# Patient Record
Sex: Female | Born: 1950 | Race: White | Hispanic: No | Marital: Married | Smoking: Never smoker
Health system: Southern US, Community
[De-identification: ages and names within clinical notes are randomized; demographics above are authoritative.]

## PROBLEM LIST (undated history)

## (undated) DIAGNOSIS — L0390 Cellulitis, unspecified: Secondary | ICD-10-CM

## (undated) DIAGNOSIS — M1990 Unspecified osteoarthritis, unspecified site: Secondary | ICD-10-CM

## (undated) DIAGNOSIS — I10 Essential (primary) hypertension: Secondary | ICD-10-CM

## (undated) DIAGNOSIS — H9190 Unspecified hearing loss, unspecified ear: Secondary | ICD-10-CM

## (undated) DIAGNOSIS — E669 Obesity, unspecified: Secondary | ICD-10-CM

## (undated) DIAGNOSIS — E7800 Pure hypercholesterolemia, unspecified: Secondary | ICD-10-CM

## (undated) DIAGNOSIS — E119 Type 2 diabetes mellitus without complications: Secondary | ICD-10-CM

## (undated) HISTORY — PX: CHOLECYSTECTOMY: SHX55

## (undated) HISTORY — PX: ABDOMINAL HYSTERECTOMY: SHX81

---

## 1998-02-18 ENCOUNTER — Emergency Department (HOSPITAL_COMMUNITY): Admission: EM | Admit: 1998-02-18 | Discharge: 1998-02-18 | Payer: Self-pay | Admitting: Emergency Medicine

## 2000-09-26 ENCOUNTER — Encounter: Payer: Self-pay | Admitting: Emergency Medicine

## 2000-09-26 ENCOUNTER — Emergency Department (HOSPITAL_COMMUNITY): Admission: EM | Admit: 2000-09-26 | Discharge: 2000-09-26 | Payer: Self-pay | Admitting: Emergency Medicine

## 2005-02-08 ENCOUNTER — Emergency Department: Payer: Self-pay | Admitting: Emergency Medicine

## 2005-03-01 ENCOUNTER — Observation Stay: Payer: Self-pay

## 2005-03-01 ENCOUNTER — Other Ambulatory Visit: Payer: Self-pay

## 2005-05-15 ENCOUNTER — Ambulatory Visit: Payer: Self-pay | Admitting: Gastroenterology

## 2006-06-19 ENCOUNTER — Ambulatory Visit: Payer: Self-pay | Admitting: Family Medicine

## 2006-07-02 ENCOUNTER — Ambulatory Visit: Payer: Self-pay | Admitting: Family Medicine

## 2006-10-15 ENCOUNTER — Ambulatory Visit: Payer: Self-pay | Admitting: Family Medicine

## 2006-10-16 ENCOUNTER — Ambulatory Visit: Payer: Self-pay | Admitting: Family Medicine

## 2006-11-10 ENCOUNTER — Ambulatory Visit: Payer: Self-pay | Admitting: Family Medicine

## 2006-11-28 ENCOUNTER — Ambulatory Visit: Payer: Self-pay | Admitting: Family Medicine

## 2006-12-27 ENCOUNTER — Ambulatory Visit: Payer: Self-pay | Admitting: Family Medicine

## 2007-02-06 ENCOUNTER — Ambulatory Visit: Payer: Self-pay | Admitting: Family Medicine

## 2007-02-07 ENCOUNTER — Emergency Department: Payer: Self-pay | Admitting: Emergency Medicine

## 2008-01-17 ENCOUNTER — Emergency Department: Payer: Self-pay | Admitting: Emergency Medicine

## 2008-01-26 ENCOUNTER — Ambulatory Visit: Payer: Self-pay | Admitting: Family Medicine

## 2008-02-29 ENCOUNTER — Ambulatory Visit: Payer: Self-pay | Admitting: Cardiology

## 2008-03-08 ENCOUNTER — Ambulatory Visit: Payer: Self-pay | Admitting: Surgery

## 2008-03-10 ENCOUNTER — Ambulatory Visit: Payer: Self-pay | Admitting: Surgery

## 2008-05-23 ENCOUNTER — Ambulatory Visit: Payer: Self-pay | Admitting: Family Medicine

## 2009-01-26 IMAGING — US ABDOMEN ULTRASOUND
1 series · 17 of 25 positions shown · non-contrast
Comparison: none

REASON FOR EXAM: PT is Deaf interpreter notified RUQ pain IF neg Us DO
HIDA WITH CCK
COMMENTS:

[Series 1: abdomen ultrasound · 17 of 52 slices shown]
[im 1/52]
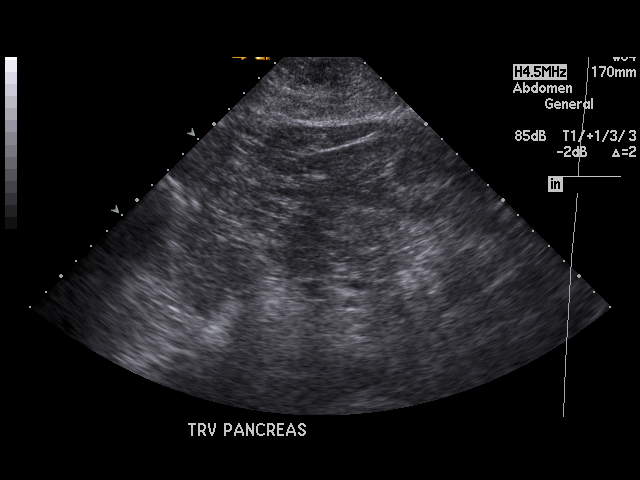
[im 5/52]
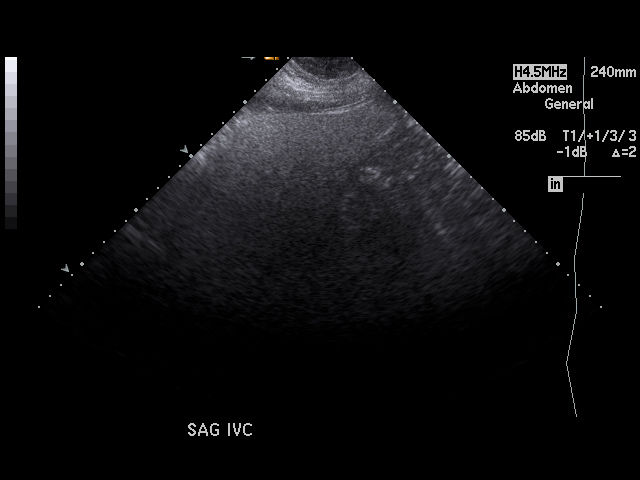
[im 7/52]
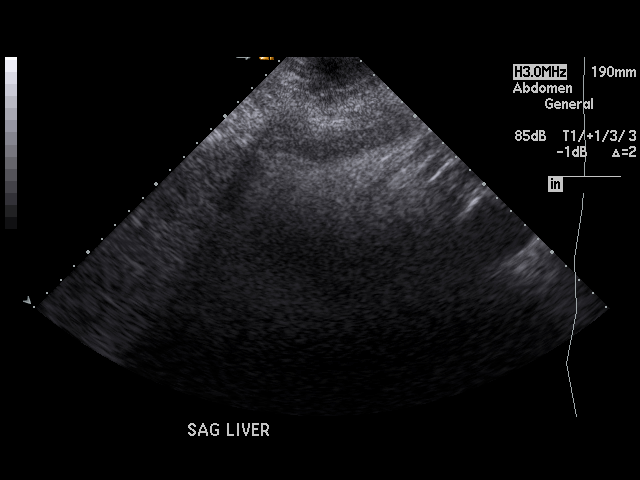
[im 11/52]
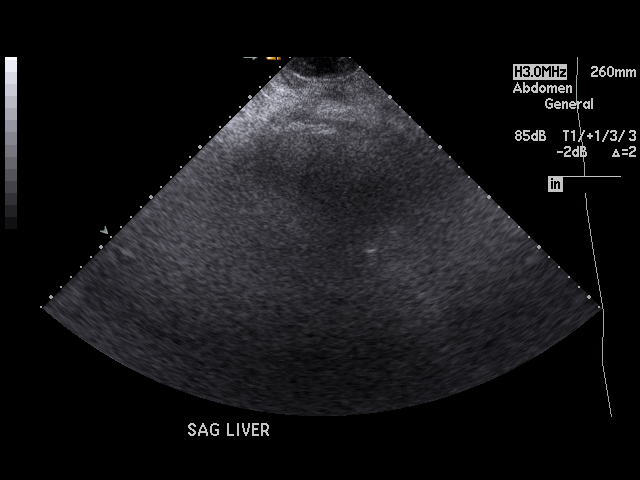
[im 13/52]
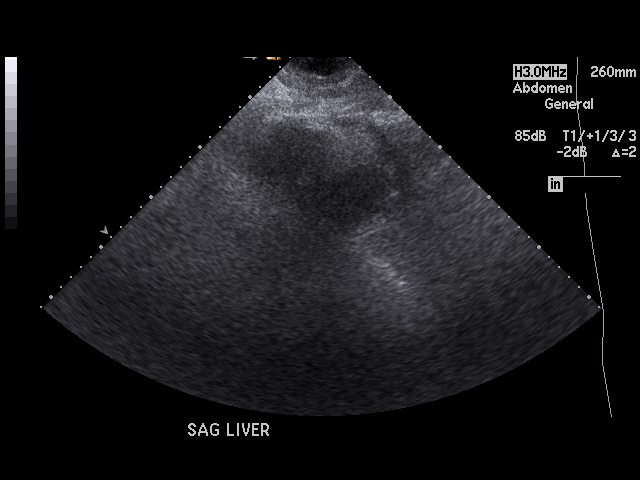
[im 18/52]
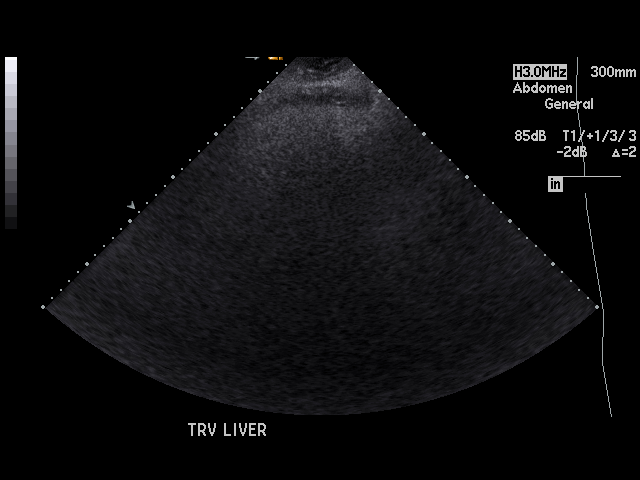
[im 20/52]
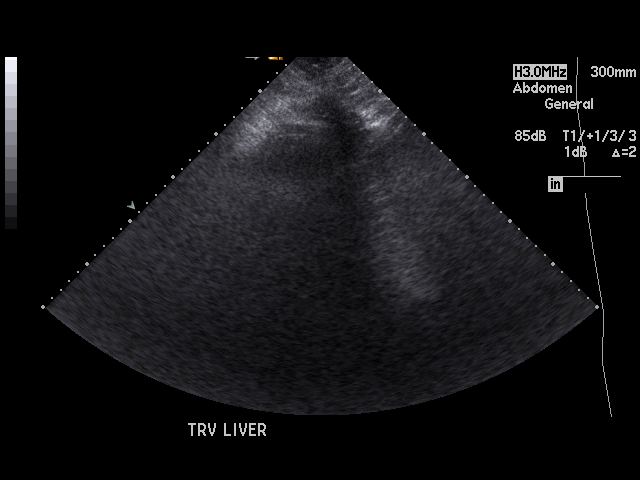
[im 24/52]
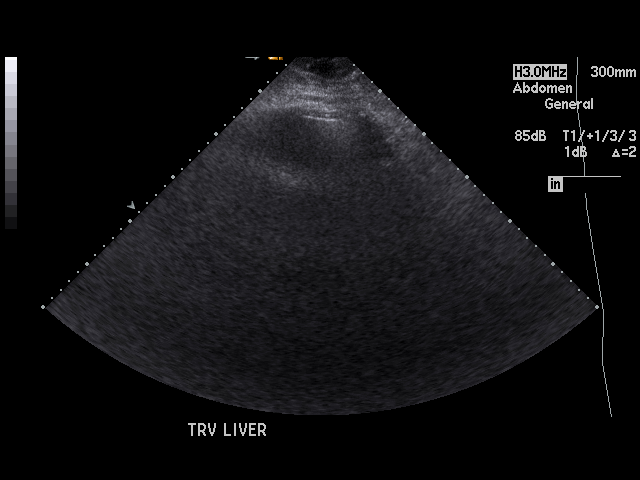
[im 26/52]
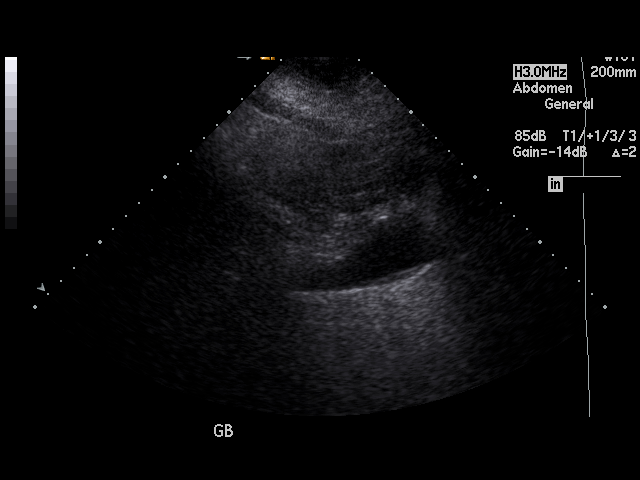
[im 28/52]
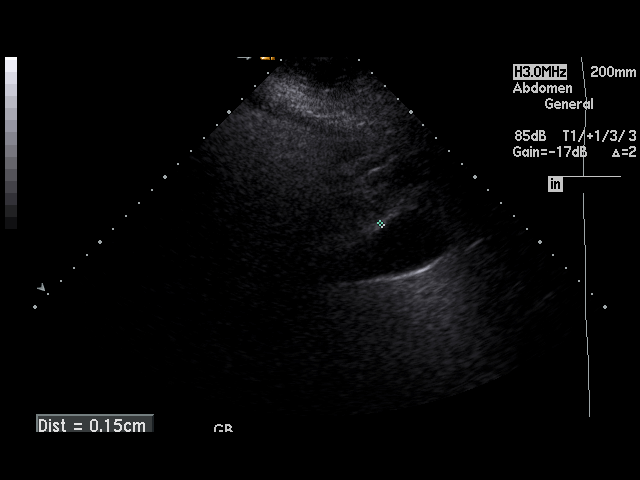
[im 32/52]
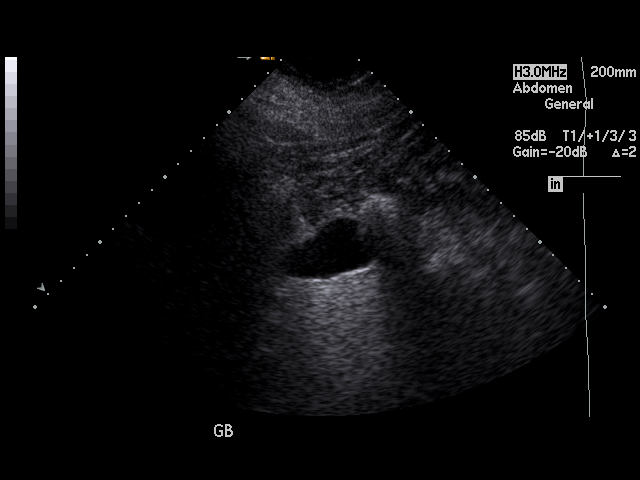
[im 35/52]
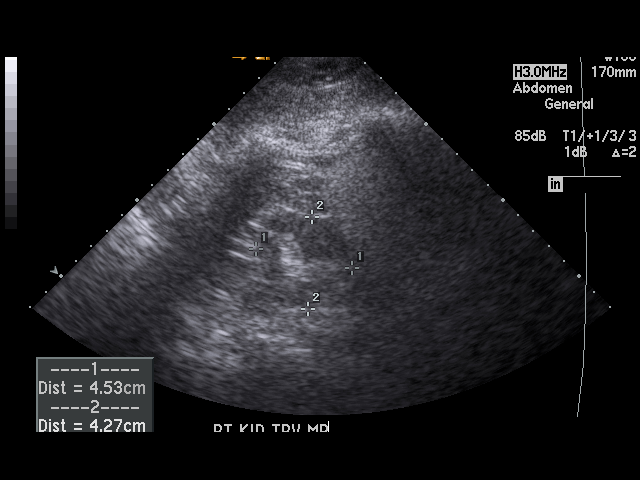
[im 39/52]
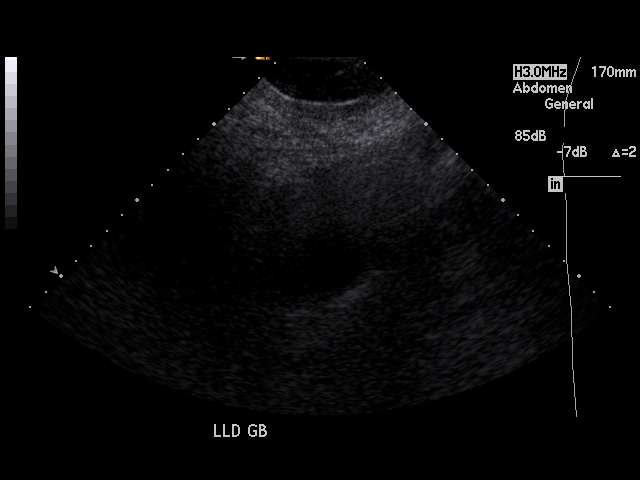
[im 41/52]
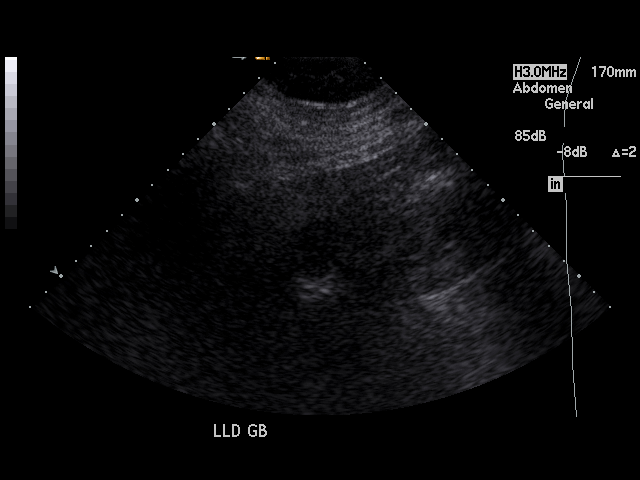
[im 45/52]
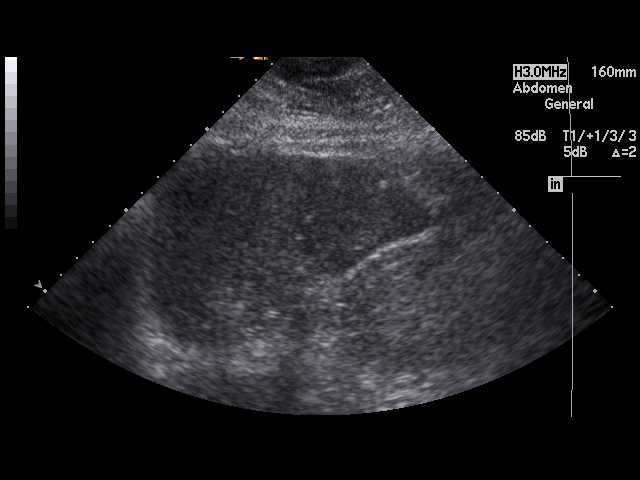
[im 47/52]
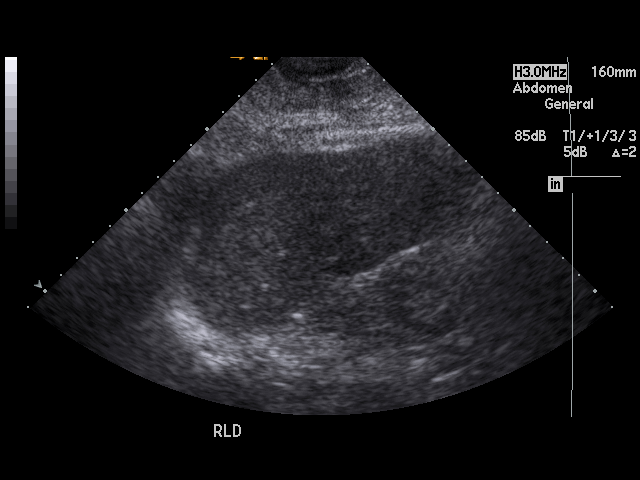
[im 52/52]
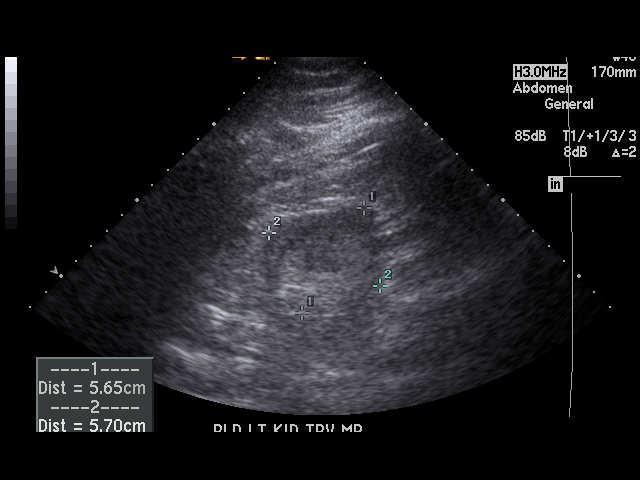

[17 of 25 positions shown; findings below may reference images not displayed]

PROCEDURE:     US  - US ABDOMEN GENERAL SURVEY  - January 26, 2008  [DATE]

RESULT:     The liver is dense, consistent with fatty infiltration. The
spleen is within normal limits for size.

There are noted a few calcifications in the spleen compatible with calcified
granulomas. The abdominal aorta and inferior vena cava are not visualized
adequately for evaluation on this exam. The pancreas also is obscured. No
gallstones are seen. There is no thickening of the gallbladder wall. The
common bile duct measures 6 mm in diameter which is within normal limits.
The kidneys show no hydronephrosis.
IMPRESSION: 1. No gallstones are seen.
2. Probable fatty infiltration of the liver.
3. There is no ascites.
4. The kidneys show no hydronephrosis.

## 2009-01-26 IMAGING — NM NUCLEAR MEDICINE HEPATOHBILIARY INCLUDE GB
2 series · 12 of 12 positions shown · non-contrast
Comparison: none

REASON FOR EXAM: PT is Deaf interpreter notified RUQ pain IF neg Us DO
HIDA WITH CCK
COMMENTS:

[Series 1000: gallbladder dynamic (results) · 4.80mm/px · 6 of 60 frames shown]
[frame 6/60]
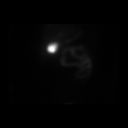
[frame 16/60]
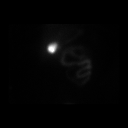
[frame 26/60]
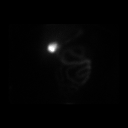
[frame 36/60]
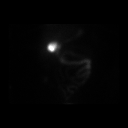
[frame 46/60]
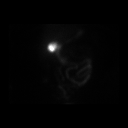
[frame 56/60]
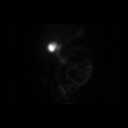

[Series 1000: gallbladder dynamic · 4.80mm/px · 6 of 60 frames shown]
[frame 6/60]
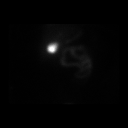
[frame 16/60]
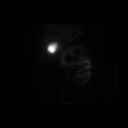
[frame 26/60]
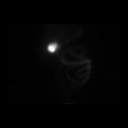
[frame 36/60]
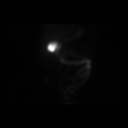
[frame 46/60]
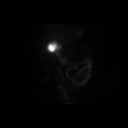
[frame 56/60]
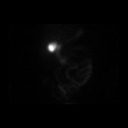

[12 of 12 positions shown; findings below may reference images not displayed]

PROCEDURE:     NM  - NM HEPATO WITH GB EJECT FRACTION  - January 26, 2008 [DATE]

RESULT:     Following intravenous administration of 7.9 mCi technetium-55m
choletec, there is noted tracer activity in the gallbladder, common duct and
proximal small bowel at 50 minutes.

The gallbladder ejection fraction at 30 minutes measures 16%, which is below
the normal range.
IMPRESSION: 1.     Normal hepatobiliary scan. The gallbladder ejection fraction measures
16%, which is abnormally low and is in the hypokinetic range.

## 2009-01-31 ENCOUNTER — Emergency Department: Payer: Self-pay | Admitting: Emergency Medicine

## 2009-02-08 ENCOUNTER — Ambulatory Visit: Payer: Self-pay | Admitting: Family Medicine

## 2009-02-09 ENCOUNTER — Ambulatory Visit: Payer: Self-pay | Admitting: Gastroenterology

## 2009-07-06 ENCOUNTER — Ambulatory Visit: Payer: Self-pay | Admitting: Family Medicine

## 2010-04-06 ENCOUNTER — Emergency Department: Payer: Self-pay | Admitting: Emergency Medicine

## 2011-02-02 ENCOUNTER — Emergency Department: Payer: Self-pay | Admitting: Emergency Medicine

## 2012-11-19 ENCOUNTER — Ambulatory Visit: Payer: Self-pay | Admitting: Family Medicine

## 2013-03-15 ENCOUNTER — Ambulatory Visit: Payer: Self-pay | Admitting: Physical Medicine and Rehabilitation

## 2013-06-18 ENCOUNTER — Ambulatory Visit: Payer: Self-pay | Admitting: Gastroenterology

## 2013-06-21 LAB — PATHOLOGY REPORT

## 2013-10-28 ENCOUNTER — Emergency Department: Payer: Self-pay | Admitting: Emergency Medicine

## 2013-10-28 LAB — BASIC METABOLIC PANEL
ANION GAP: 3 — AB (ref 7–16)
BUN: 15 mg/dL (ref 7–18)
CHLORIDE: 105 mmol/L (ref 98–107)
Calcium, Total: 9 mg/dL (ref 8.5–10.1)
Co2: 32 mmol/L (ref 21–32)
Creatinine: 0.84 mg/dL (ref 0.60–1.30)
EGFR (African American): >60
EGFR (Non-African Amer.): >60
Glucose: 102 mg/dL — ABNORMAL HIGH (ref 65–99)
Osmolality: 280 (ref 275–301)
Potassium: 3.5 mmol/L (ref 3.5–5.1)
Sodium: 140 mmol/L (ref 136–145)

## 2013-10-28 LAB — TROPONIN I: Troponin-I: <0.02 ng/mL

## 2013-10-28 LAB — CBC WITH DIFFERENTIAL/PLATELET
Basophil #: 0 10*3/uL (ref 0.0–0.1)
Basophil %: 0.7 %
Eosinophil #: 0.1 10*3/uL (ref 0.0–0.7)
Eosinophil %: 2.5 %
HCT: 41.5 % (ref 35.0–47.0)
HGB: 14.1 g/dL (ref 12.0–16.0)
LYMPHS ABS: 1.2 10*3/uL (ref 1.0–3.6)
Lymphocyte %: 20.3 %
MCH: 28.3 pg (ref 26.0–34.0)
MCHC: 33.9 g/dL (ref 32.0–36.0)
MCV: 84 fL (ref 80–100)
MONO ABS: 0.3 x10 3/mm (ref 0.2–0.9)
MONOS PCT: 5.7 %
NEUTROS PCT: 70.8 %
Neutrophil #: 4.1 10*3/uL (ref 1.4–6.5)
PLATELETS: 125 10*3/uL — AB (ref 150–440)
RBC: 4.97 10*6/uL (ref 3.80–5.20)
RDW: 13.4 % (ref 11.5–14.5)
WBC: 5.8 10*3/uL (ref 3.6–11.0)

## 2013-10-28 LAB — URINALYSIS, COMPLETE
Bilirubin,UR: NEGATIVE
Blood: NEGATIVE
GLUCOSE, UR: NEGATIVE mg/dL (ref 0–75)
KETONE: NEGATIVE
Leukocyte Esterase: NEGATIVE
NITRITE: NEGATIVE
PH: 6 (ref 4.5–8.0)
Protein: NEGATIVE
RBC,UR: NONE SEEN /HPF (ref 0–5)
SPECIFIC GRAVITY: 1.015 (ref 1.003–1.030)

## 2014-06-07 ENCOUNTER — Observation Stay: Payer: Self-pay

## 2014-06-07 LAB — CBC WITH DIFFERENTIAL/PLATELET
Basophil #: 0 10*3/uL (ref 0.0–0.1)
Basophil %: 0.2 %
Eosinophil #: 0 10*3/uL (ref 0.0–0.7)
Eosinophil %: 0 %
HCT: 41 % (ref 35.0–47.0)
HGB: 13.8 g/dL (ref 12.0–16.0)
Lymphocyte #: 0.6 10*3/uL — ABNORMAL LOW (ref 1.0–3.6)
Lymphocyte %: 4 %
MCH: 28.3 pg (ref 26.0–34.0)
MCHC: 33.6 g/dL (ref 32.0–36.0)
MCV: 84 fL (ref 80–100)
Monocyte #: 0.4 x10 3/mm (ref 0.2–0.9)
Monocyte %: 2.7 %
Neutrophil #: 13.8 10*3/uL — ABNORMAL HIGH (ref 1.4–6.5)
Neutrophil %: 93.1 %
Platelet: 112 10*3/uL — ABNORMAL LOW (ref 150–440)
RBC: 4.87 10*6/uL (ref 3.80–5.20)
RDW: 13.8 % (ref 11.5–14.5)
WBC: 14.9 10*3/uL — ABNORMAL HIGH (ref 3.6–11.0)

## 2014-06-07 LAB — COMPREHENSIVE METABOLIC PANEL
Albumin: 2.8 g/dL — ABNORMAL LOW (ref 3.4–5.0)
Alkaline Phosphatase: 70 U/L
Anion Gap: 6 — ABNORMAL LOW (ref 7–16)
BUN: 26 mg/dL — ABNORMAL HIGH (ref 7–18)
Bilirubin,Total: 1 mg/dL (ref 0.2–1.0)
Calcium, Total: 9.6 mg/dL (ref 8.5–10.1)
Chloride: 104 mmol/L (ref 98–107)
Co2: 29 mmol/L (ref 21–32)
Creatinine: 1.29 mg/dL (ref 0.60–1.30)
EGFR (African American): 50 — ABNORMAL LOW
EGFR (Non-African Amer.): 43 — ABNORMAL LOW
Glucose: 120 mg/dL — ABNORMAL HIGH (ref 65–99)
Osmolality: 283 (ref 275–301)
Potassium: 3.5 mmol/L (ref 3.5–5.1)
SGOT(AST): 46 U/L — ABNORMAL HIGH (ref 15–37)
SGPT (ALT): 61 U/L
Sodium: 139 mmol/L (ref 136–145)
Total Protein: 6.6 g/dL (ref 6.4–8.2)

## 2014-06-08 LAB — BASIC METABOLIC PANEL
Anion Gap: 3 — ABNORMAL LOW (ref 7–16)
BUN: 25 mg/dL — ABNORMAL HIGH (ref 7–18)
Calcium, Total: 8.8 mg/dL (ref 8.5–10.1)
Chloride: 103 mmol/L (ref 98–107)
Co2: 33 mmol/L — ABNORMAL HIGH (ref 21–32)
Creatinine: 1.32 mg/dL — ABNORMAL HIGH (ref 0.60–1.30)
EGFR (African American): 49 — ABNORMAL LOW
EGFR (Non-African Amer.): 42 — ABNORMAL LOW
Glucose: 115 mg/dL — ABNORMAL HIGH (ref 65–99)
Osmolality: 283 (ref 275–301)
Potassium: 3.3 mmol/L — ABNORMAL LOW (ref 3.5–5.1)
Sodium: 139 mmol/L (ref 136–145)

## 2014-06-08 LAB — CBC WITH DIFFERENTIAL/PLATELET
Basophil #: 0 10*3/uL (ref 0.0–0.1)
Basophil %: 0.1 %
Eosinophil #: 0 10*3/uL (ref 0.0–0.7)
Eosinophil %: 0.5 %
HCT: 36 % (ref 35.0–47.0)
HGB: 11.8 g/dL — ABNORMAL LOW (ref 12.0–16.0)
Lymphocyte #: 0.7 10*3/uL — ABNORMAL LOW (ref 1.0–3.6)
Lymphocyte %: 8 %
MCH: 28 pg (ref 26.0–34.0)
MCHC: 32.9 g/dL (ref 32.0–36.0)
MCV: 85 fL (ref 80–100)
Monocyte #: 0.4 x10 3/mm (ref 0.2–0.9)
Monocyte %: 4.4 %
Neutrophil #: 7.5 10*3/uL — ABNORMAL HIGH (ref 1.4–6.5)
Neutrophil %: 87 %
Platelet: 97 10*3/uL — ABNORMAL LOW (ref 150–440)
RBC: 4.23 10*6/uL (ref 3.80–5.20)
RDW: 13.8 % (ref 11.5–14.5)
WBC: 8.6 10*3/uL (ref 3.6–11.0)

## 2014-06-09 LAB — CBC WITH DIFFERENTIAL/PLATELET
Basophil #: 0 10*3/uL (ref 0.0–0.1)
Basophil %: 0.3 %
Eosinophil #: 0.2 10*3/uL (ref 0.0–0.7)
Eosinophil %: 1.8 %
HCT: 38.1 % (ref 35.0–47.0)
HGB: 12.9 g/dL (ref 12.0–16.0)
Lymphocyte #: 1.4 10*3/uL (ref 1.0–3.6)
Lymphocyte %: 16.5 %
MCH: 28.6 pg (ref 26.0–34.0)
MCHC: 34 g/dL (ref 32.0–36.0)
MCV: 84 fL (ref 80–100)
Monocyte #: 0.6 x10 3/mm (ref 0.2–0.9)
Monocyte %: 7.3 %
Neutrophil #: 6.2 10*3/uL (ref 1.4–6.5)
Neutrophil %: 74.1 %
Platelet: 133 10*3/uL — ABNORMAL LOW (ref 150–440)
RBC: 4.52 10*6/uL (ref 3.80–5.20)
RDW: 13.8 % (ref 11.5–14.5)
WBC: 8.4 10*3/uL (ref 3.6–11.0)

## 2014-06-09 LAB — BASIC METABOLIC PANEL
Anion Gap: 8 (ref 7–16)
BUN: 16 mg/dL (ref 7–18)
Calcium, Total: 8.7 mg/dL (ref 8.5–10.1)
Chloride: 100 mmol/L (ref 98–107)
Co2: 29 mmol/L (ref 21–32)
Creatinine: 1.19 mg/dL (ref 0.60–1.30)
EGFR (African American): 55 — ABNORMAL LOW
EGFR (Non-African Amer.): 48 — ABNORMAL LOW
Glucose: 115 mg/dL — ABNORMAL HIGH (ref 65–99)
Osmolality: 276 (ref 275–301)
Potassium: 3.1 mmol/L — ABNORMAL LOW (ref 3.5–5.1)
Sodium: 137 mmol/L (ref 136–145)

## 2014-06-09 LAB — MAGNESIUM: Magnesium: 2 mg/dL

## 2014-06-10 LAB — BASIC METABOLIC PANEL
ANION GAP: 7 (ref 7–16)
BUN: 13 mg/dL (ref 7–18)
CO2: 33 mmol/L — AB (ref 21–32)
CREATININE: 1.09 mg/dL (ref 0.60–1.30)
Calcium, Total: 8.9 mg/dL (ref 8.5–10.1)
Chloride: 98 mmol/L (ref 98–107)
EGFR (African American): >60
EGFR (Non-African Amer.): 53 — ABNORMAL LOW
Glucose: 139 mg/dL — ABNORMAL HIGH (ref 65–99)
OSMOLALITY: 278 (ref 275–301)
POTASSIUM: 3.2 mmol/L — AB (ref 3.5–5.1)
Sodium: 138 mmol/L (ref 136–145)

## 2014-06-12 LAB — CULTURE, BLOOD (SINGLE)

## 2014-06-13 LAB — CBC WITH DIFFERENTIAL/PLATELET
BASOS ABS: 0.1 10*3/uL (ref 0.0–0.1)
Basophil %: 0.5 %
Eosinophil #: 0.5 10*3/uL (ref 0.0–0.7)
Eosinophil %: 4.6 %
HCT: 38.6 % (ref 35.0–47.0)
HGB: 12.7 g/dL (ref 12.0–16.0)
LYMPHS PCT: 22 %
Lymphocyte #: 2.3 10*3/uL (ref 1.0–3.6)
MCH: 27.8 pg (ref 26.0–34.0)
MCHC: 32.8 g/dL (ref 32.0–36.0)
MCV: 85 fL (ref 80–100)
Monocyte #: 0.8 x10 3/mm (ref 0.2–0.9)
Monocyte %: 7.6 %
NEUTROS ABS: 6.8 10*3/uL — AB (ref 1.4–6.5)
Neutrophil %: 65.3 %
PLATELETS: 224 10*3/uL (ref 150–440)
RBC: 4.55 10*6/uL (ref 3.80–5.20)
RDW: 13.4 % (ref 11.5–14.5)
WBC: 10.4 10*3/uL (ref 3.6–11.0)

## 2014-06-13 LAB — BASIC METABOLIC PANEL
Anion Gap: 9 (ref 7–16)
BUN: 18 mg/dL (ref 7–18)
CALCIUM: 9 mg/dL (ref 8.5–10.1)
Chloride: 94 mmol/L — ABNORMAL LOW (ref 98–107)
Co2: 34 mmol/L — ABNORMAL HIGH (ref 21–32)
Creatinine: 0.98 mg/dL (ref 0.60–1.30)
EGFR (African American): >60
Glucose: 138 mg/dL — ABNORMAL HIGH (ref 65–99)
OSMOLALITY: 278 (ref 275–301)
Potassium: 3.5 mmol/L (ref 3.5–5.1)
Sodium: 137 mmol/L (ref 136–145)

## 2014-09-18 ENCOUNTER — Emergency Department: Payer: Self-pay | Admitting: Internal Medicine

## 2014-09-18 LAB — COMPREHENSIVE METABOLIC PANEL
ALK PHOS: 82 U/L
Albumin: 3.3 g/dL — ABNORMAL LOW (ref 3.4–5.0)
Anion Gap: 6 — ABNORMAL LOW (ref 7–16)
BILIRUBIN TOTAL: 0.4 mg/dL (ref 0.2–1.0)
BUN: 21 mg/dL — ABNORMAL HIGH (ref 7–18)
CO2: 30 mmol/L (ref 21–32)
Calcium, Total: 9.1 mg/dL (ref 8.5–10.1)
Chloride: 106 mmol/L (ref 98–107)
Creatinine: 0.8 mg/dL (ref 0.60–1.30)
EGFR (African American): >60
EGFR (Non-African Amer.): >60
GLUCOSE: 93 mg/dL (ref 65–99)
OSMOLALITY: 286 (ref 275–301)
Potassium: 3.8 mmol/L (ref 3.5–5.1)
SGOT(AST): 19 U/L (ref 15–37)
SGPT (ALT): 23 U/L
Sodium: 142 mmol/L (ref 136–145)
TOTAL PROTEIN: 7 g/dL (ref 6.4–8.2)

## 2014-09-18 LAB — CBC
HCT: 41.3 % (ref 35.0–47.0)
HGB: 13.7 g/dL (ref 12.0–16.0)
MCH: 27.9 pg (ref 26.0–34.0)
MCHC: 33.3 g/dL (ref 32.0–36.0)
MCV: 84 fL (ref 80–100)
Platelet: 158 10*3/uL (ref 150–440)
RBC: 4.93 10*6/uL (ref 3.80–5.20)
RDW: 13.5 % (ref 11.5–14.5)
WBC: 7.2 10*3/uL (ref 3.6–11.0)

## 2014-09-18 LAB — URINALYSIS, COMPLETE
BACTERIA: NONE SEEN
BLOOD: NEGATIVE
Bilirubin,UR: NEGATIVE
Glucose,UR: NEGATIVE mg/dL (ref 0–75)
KETONE: NEGATIVE
LEUKOCYTE ESTERASE: NEGATIVE
Nitrite: NEGATIVE
Ph: 7 (ref 4.5–8.0)
Protein: NEGATIVE
RBC,UR: NONE SEEN /HPF (ref 0–5)
Specific Gravity: 1.011 (ref 1.003–1.030)
Squamous Epithelial: <1
WBC UR: <1 /HPF (ref 0–5)

## 2014-10-02 ENCOUNTER — Other Ambulatory Visit: Payer: Self-pay | Admitting: Internal Medicine

## 2015-01-19 ENCOUNTER — Emergency Department: Payer: Self-pay | Admitting: Emergency Medicine

## 2015-02-18 NOTE — H&P (Signed)
PATIENT NAME:  Judith Mitchell, Judith Mitchell MR#:  [id number] DATE OF BIRTH:  [birth year]  PRIMARY CARE PHYSICIAN:  Irven Easterly. Kary Kos, MD  CHIEF COMPLAINT: Leg pain.   HISTORY OF PRESENT ILLNESS: This is a 64 year old female who presents to the ER after a fall on Saturday. She was sitting in the rocking chair and fell on her knee.  Sunday, she felt very weak and terrible, did not want to eat. Yesterday, she went into the walk-in clinic and was given an antibiotic for cellulitis of the leg. It has been getting worse and worse and extending up the leg. She vomited a couple times yesterday and today. When standing she is weak and dizzy and had to hold on. Her leg is swollen and very painful.   In the ER, she was found to have a cellulitis up the entire right lower leg from the ankle up above the knee and hospitalist services were contacted for further evaluation.   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 Diabetes, hyperlipidemia, and hypertension.   PAST SURGICAL HISTORY: Hysterectomy.   ALLERGIES: No known drug allergies.   MEDICATIONS: Include metformin 500 mg daily, Naprosyn 500 mg daily, Dyazide 25/37.5 one tablet daily, Zocor 40 mg at bedtime.   SOCIAL HISTORY: No alcohol. No smoking. No drug use. Works at [employer].   FAMILY HISTORY: Father died of an MI. Mother died of lung issue, but also had heart disease.   REVIEW OF SYSTEMS:  CONSTITUTIONAL: Positive for chills. Positive for weakness. No weight gain. No weight loss. EYES: She does wear glasses and had some blurry vision of the left eye. EARS, NOSE, MOUTH AND THROAT: The patient is deaf. No runny nose. No sore throat. No difficulty swallowing.  CARDIOVASCULAR: No chest pain. No palpitations.  RESPIRATORY:  No shortness of breath. No cough. No sputum. No hemoptysis.   GASTROINTESTINAL: Positive for nausea. Positive for vomiting. No abdominal pain. Had diarrhea today. No bright red blood  per rectum. No melena.   GENITOURINARY: No burning on urination. No hematuria.  MUSCULOSKELETAL: Positive for left leg pain. Also some shoulder and back pain from her fall.  SKIN: Extending redness on the skin.  NEUROLOGIC: Feels like she is going to pass out. Has to hold when walking around.  PSYCHIATRIC: Positive for depression.  HEMATOLOGIC AND LYMPHATIC: No anemia. No easy bruising or bleeding.   PHYSICAL EXAMINATION: VITAL SIGNS: On presentation to the ER included a temperature of 98, pulse 88 respirations 20, blood pressure 115/69, pulse oximetry 97% on room air.  GENERAL: No respiratory distress.  EYES: Conjunctivae and lids normal. Pupils equal, round, and reactive to light. Extraocular muscles intact. No nystagmus.  EARS, NOSE, MOUTH, AND THROAT: Tympanic membranes: No erythema. Nasal mucosa: No erythema. Throat:  No erythema, no exudate seen. Lips and gums: No lesions.  NECK: No JVD. No bruits. No lymphadenopathy. No thyromegaly. No thyroid nodules palpated.  LUNGS: Clear to auscultation. No use of accessory muscles to breathe. No rhonchi, rales, or wheeze heard.  CARDIOVASCULAR SYSTEM: S1, S2 normal. No gallops, rubs, or murmurs heard. Carotid upstroke 2+ bilaterally. No bruits.  EXTREMITIES: Dorsalis pedis pulses 2+ bilaterally, 2+ edema bilateral lower extremity.  ABDOMEN: Soft, nontender. No organomegaly, splenomegaly. Normoactive bowel sounds. No masses felt.  LYMPHATIC: No lymph nodes in the neck or groin.  MUSCULOSKELETAL: 2+ edema. No clubbing. No cyanosis.  SKIN: Erythema on the right leg extending from just below the ankle to above the right  knee medially. There is an area of skin opening on the medial right leg and most of the patient's pain is in the posterior side.  NEUROLOGIC: Cranial nerves II through XII grossly intact. Deep tendon reflexes 1+ bilateral lower extremities.  PSYCHIATRIC: The patient is oriented to person, place, and time.   LABORATORY AND RADIOLOGICAL  DATA: White blood cell count 14.9, H and H 13.8 and 41.0, platelet count of 112,000, glucose 120, BUN 26, creatinine 1.29, sodium 139, potassium 3.5, chloride 104, CO2 of 29, calcium 9.6. Liver function tests: AST slightly elevated at 46, albumin low at 2.8.   ASSESSMENT AND PLAN: 1.  Cellulitis with leukocytosis. Since the patient is a diabetic, will get aggressive with antibiotics with vancomycin and Unasyn and continue to monitor daily.  2.  Thrombocytopenia, looking back at old laboratories this looks chronic.  3.  Diabetes. We will hold on metformin at this time and go with sliding scale coverage.  4.  Hypertension.  Continue Dyazide, but watch creatinine closely.  5.  Hyperlipidemia.  Continue Zocor.  6.  Renal insufficiency. We will give gentle intravenous fluid hydration   Time spent on admission, 55 minutes. Translation via sign language interpreter provided by the hospital.    ____________________________ Tana Conch. Leslye Peer, MD rjw:LT D: 06/07/2014 16:48:24 ET T: 06/07/2014 17:37:17 ET JOB#: [id number]  cc: Tana Conch. Leslye Peer, MD, <Dictator> Irven Easterly. Kary Kos, [place] MD ELECTRONICALLY SIGNED 06/07/2014 20:00

## 2015-02-18 NOTE — Consult Note (Signed)
PATIENT NAME:  Judith Mitchell, Judith Mitchell MR#:  [id number] DATE OF BIRTH:  1951/07/20  DATE OF CONSULTATION:  06/09/2014  CONSULTING PHYSICIAN:  Cheral Marker. Ola Spurr, MD  REQUESTING PHYSICIAN: Dr. Leslye Peer.  REASON FOR CONSULTATION: Cellulitis.   HISTORY OF PRESENT ILLNESS: This is a very pleasant 64 year old female with a history of diabetes, as well as deafness. She was admitted August 11th after falling on her right knee. She then progressively became weak and had redness in her leg. She was seen in a walk-in clinic and given antibiotics but she continued to get increasing spreading redness and weakness and dizziness. She was also having vomiting prior to admission. On the 11th she was admitted to the hospitalist service with worsening cellulitis for IV antibiotics.   PAST MEDICAL HISTORY:  1. Deafness.  2. Diabetes.  3. Hyperlipidemia.  4. Hypertension.   PAST SURGICAL HISTORY: Hysterectomy.   ALLERGIES: No known drug allergies.   ANTIBIOTICS SINCE ADMISSION: Include Unasyn begun August 11th and vancomycin begun August 10th.  SOCIAL HISTORY: She is married. She does not smoke or drink. She lives with her husband.   FAMILY HISTORY: Noncontributory.   PHYSICAL EXAMINATION:  VITAL SIGNS: The patient is afebrile currently and has been throughout the admission. Current temperature 97.7, pulse 77, blood pressure 139/83, respirations 18, sat 98% on room air.  GENERAL: She is pleasant, interactive, overweight, lying in bed.  HEENT: Pupils equal, round, and reactive to light and accommodation. Extraocular movements are intact. Sclerae anicteric. Oropharynx clear.  NECK: Supple.  HEART: Regular.  LUNGS: Clear.  ABDOMEN: Soft, nontender, obese, nondistended.  EXTREMITIES: Her right lower extremity has edema and erythema from just below the knee to the ankle. There are small vesicles. On the posterior part of the calf there are some small bullae. There is also an area of erythema at the distal top of  the inner right thigh. No purulence is noted.   LABORATORY DATA: White blood count on admission 14.9, now down to 8.4. Hemoglobin 12.9, platelets 133,000. Renal function is normal with a creatinine of 1.19. Sugars have been well controlled. LFTs are normal except slight elevation of AST. Blood cultures x 2 are negative. No imaging is available.   IMPRESSION: A 64 year old with diabetes and deafness, admitted with a cellulitis of the right lower extremity with minimal responsiveness over the last 2 days. She does have diabetes, but this seems relatively well controlled. There is no evidence of purulence or drainable abscess.   I suspect she needs elevation of the leg as when I saw her, she had it flat in bed. She is not toxic and has stable vital signs, has not been febrile and her white count has improved.   RECOMMENDATIONS:  1. Continue current antibiotics.  2. I have advised the patient and the nurse to provide 6 pillows to elevate the legs as much as possible.  3. This will likely progress and the skin will desquamate. It is unlikely she will need prolonged course of IV antibiotics for more than the next 2-3 days and hopefully will be able to be discharged in the next several days on oral antibiotics. Thank you for the consult. I will be glad to follow with you.      ____________________________ Cheral Marker. Ola Spurr, MD dpf:lt D: 06/09/2014 15:51:32 ET T: 06/09/2014 16:09:57 ET JOB#: [id number]  cc: Cheral Marker. Ola Spurr, MD, <Dictator> Shlome Baldree Ola Spurr MD ELECTRONICALLY SIGNED 06/17/2014 22:28

## 2015-02-18 NOTE — Discharge Summary (Signed)
PATIENT NAME:  Judith Mitchell, DELANCY MR#:  [id number] DATE OF BIRTH:  20-Jan-1951  ADMITTING DIAGNOSIS:  Cellulitis with leukocytosis.  ASSOCIATED DIAGNOSES: 1.  Thrombocytopenia.  2.  Diabetes.  3.  Hypertension.  4.  Hyperlipidemia.   CONSULTATIONS:  Cheral Marker. Ola Spurr, MD, infectious disease.   PROCEDURES: None.   BRIEF HISTORY OF PRESENT ILLNESS:  This very pleasant 65 year old female presents to the Emergency Room after fall on Saturday. She reports that she was sitting in a rocking chair and fell onto her knee. She felt suddenly very weak. She went to a walk-in clinic and was given an antibiotic for cellulitis of the right leg. For the days prior to presentation, pain, swelling, and redness in the right leg continued to get worse despite oral antibiotic treatment. She reported that she had vomited several times on the day before  admission.  Hospitalist services were requested to admit for cellulitis.  HOSPITAL COURSE AND TREATMENT: Problem #1:  Cellulitis of the right leg: The patient followed during hospitalization by  infectious disease. She was initially treated with vancomycin and Unasyn upon admission. She was started on Ancef on August 14th with marketed improvement. She was discharged on Keflex 500 mg every 6 hours for 1 week and will follow up in the ID clinic with Dr. Ola Spurr on Friday, August 20th at noon.  Problem #2:  Diabetes: Continue metformin.  Problem #3:  Hypertension: Continue hydrochlorothiazide-triamterene.  Problem #4:  Hyperlipidemia: Continue Zocor.     DISCHARGE EXAMINATION: VITALS: Temperature 98 degrees, pulse 79, respirations 18, blood pressure 121/76, oxygenation is 96% on room air.  GENERAL: No acute distress. The patient is comfortable in the bed.   HEENT: Pupils are equal, round, and reactive, conjunctivae clear. Oral mucosa is pink and moist. Oropharynx is clear.  RESPIRATORY: Lungs are clear to auscultation bilaterally with good air movement.   CARDIOLOGY:  Regular rate and rhythm, no murmurs, rubs, or gallops, no JVD. No edema.  ABDOMEN: Soft, nontender, nondistended. No guarding, no rebound.  EXTREMITIES AND SKIN:  The right lower leg is erythematous from the ankle to the knee, much less than on prior exams. Small pustules have resolved. There is an area of blistering on the medial knee with no open wounds.  NEUROLOGIC: Cranial nerves are grossly intact with the exception of hearing. Motor and sensory function is intact.  PSYCHIATRIC: The patient is alert and oriented. She has good insight.   LABORATORY DATA: On day of discharge, sodium 137, potassium 3.5, chloride 94, bicarbonate 34, BUN 18, creatinine 0.9, glucose 138. White blood cells 10.4, hemoglobin 12.7, platelets 224,000,  MCV is 85.   CONDITION ON DISCHARGE:  Good.   DISPOSITION: The patient is discharged to home with her husband.   DISCHARGE MEDICATIONS: 1.  Naproxen 500 mg tablet, 1 tablet orally every 8 hours as needed for pain.  2.  Simvastatin 40 mg 1 tablet daily.  3.  Metformin 500 mg 1 tablet twice a day.  4.  Hydrochlorothiazide-triamterene 25/37.5 mg 1 tablet once a day for blood pressure.  5.  Keflex 500 mg 1 tablet 4 times a day for the next 5 days and then stop.   DISCHARGE INSTRUCTIONS:  The patient is instructed to elevate her leg when not standing.  She is advised to stay off of her feet as much as possible and has received a work excuse until 06/20/2014 from Dr. Ola Spurr. She will follow up in the Infectious Disease Clinic on Friday, August 21 at noon.  DIET: Continue with carbohydrate-modified diet.    ____________________________ Earleen Newport. Volanda Napoleon, MD cpw:LT D: 06/13/2014 15:53:00 ET T: 06/13/2014 17:36:05 ET JOB#: [id number]  cc: Barnetta Chapel P. Volanda Napoleon, MD, <Dictator> Aldean Jewett MD ELECTRONICALLY SIGNED 06/14/2014 14:48

## 2015-12-19 ENCOUNTER — Emergency Department: Payer: PPO

## 2015-12-19 ENCOUNTER — Encounter: Payer: Self-pay | Admitting: Medical Oncology

## 2015-12-19 ENCOUNTER — Emergency Department
Admission: EM | Admit: 2015-12-19 | Discharge: 2015-12-19 | Disposition: A | Discharge status: Home / Self Care | Payer: PPO | Attending: Student | Admitting: Student

## 2015-12-19 DIAGNOSIS — R112 Nausea with vomiting, unspecified: Secondary | ICD-10-CM | POA: Diagnosis not present

## 2015-12-19 DIAGNOSIS — R197 Diarrhea, unspecified: Secondary | ICD-10-CM | POA: Insufficient documentation

## 2015-12-19 DIAGNOSIS — R52 Pain, unspecified: Secondary | ICD-10-CM

## 2015-12-19 DIAGNOSIS — I10 Essential (primary) hypertension: Secondary | ICD-10-CM | POA: Diagnosis not present

## 2015-12-19 DIAGNOSIS — R079 Chest pain, unspecified: Secondary | ICD-10-CM | POA: Diagnosis not present

## 2015-12-19 DIAGNOSIS — E119 Type 2 diabetes mellitus without complications: Secondary | ICD-10-CM | POA: Diagnosis not present

## 2015-12-19 DIAGNOSIS — R1013 Epigastric pain: Secondary | ICD-10-CM | POA: Diagnosis not present

## 2015-12-19 DIAGNOSIS — Z79899 Other long term (current) drug therapy: Secondary | ICD-10-CM | POA: Insufficient documentation

## 2015-12-19 DIAGNOSIS — Z7984 Long term (current) use of oral hypoglycemic drugs: Secondary | ICD-10-CM | POA: Diagnosis not present

## 2015-12-19 HISTORY — DX: Unspecified hearing loss, unspecified ear: H91.90

## 2015-12-19 HISTORY — DX: Essential (primary) hypertension: I10

## 2015-12-19 HISTORY — DX: Pure hypercholesterolemia, unspecified: E78.00

## 2015-12-19 LAB — CBC
HEMATOCRIT: 39.3 % (ref 35.0–47.0)
HEMOGLOBIN: 13.1 g/dL (ref 12.0–16.0)
MCH: 27.5 pg (ref 26.0–34.0)
MCHC: 33.3 g/dL (ref 32.0–36.0)
MCV: 82.5 fL (ref 80.0–100.0)
Platelets: 122 10*3/uL — ABNORMAL LOW (ref 150–440)
RBC: 4.76 MIL/uL (ref 3.80–5.20)
RDW: 14.1 % (ref 11.5–14.5)
WBC: 3.6 10*3/uL (ref 3.6–11.0)

## 2015-12-19 LAB — URINALYSIS COMPLETE WITH MICROSCOPIC (ARMC ONLY)
BILIRUBIN URINE: NEGATIVE
Glucose, UA: NEGATIVE mg/dL
Hgb urine dipstick: NEGATIVE
KETONES UR: NEGATIVE mg/dL
LEUKOCYTES UA: NEGATIVE
NITRITE: NEGATIVE
PH: 5 (ref 5.0–8.0)
PROTEIN: NEGATIVE mg/dL
SPECIFIC GRAVITY, URINE: 1.027 (ref 1.005–1.030)

## 2015-12-19 LAB — BASIC METABOLIC PANEL
ANION GAP: 4 — AB (ref 5–15)
BUN: 22 mg/dL — ABNORMAL HIGH (ref 6–20)
CALCIUM: 8.9 mg/dL (ref 8.9–10.3)
CO2: 30 mmol/L (ref 22–32)
Chloride: 107 mmol/L (ref 101–111)
Creatinine, Ser: 0.72 mg/dL (ref 0.44–1.00)
GLUCOSE: 152 mg/dL — AB (ref 65–99)
POTASSIUM: 3.2 mmol/L — AB (ref 3.5–5.1)
Sodium: 141 mmol/L (ref 135–145)

## 2015-12-19 LAB — HEPATIC FUNCTION PANEL
ALBUMIN: 3.2 g/dL — AB (ref 3.5–5.0)
ALK PHOS: 53 U/L (ref 38–126)
ALT: 26 U/L (ref 14–54)
AST: 23 U/L (ref 15–41)
Bilirubin, Direct: 0.1 mg/dL (ref 0.1–0.5)
Indirect Bilirubin: 0.6 mg/dL (ref 0.3–0.9)
TOTAL PROTEIN: 6 g/dL — AB (ref 6.5–8.1)
Total Bilirubin: 0.7 mg/dL (ref 0.3–1.2)

## 2015-12-19 LAB — LIPASE, BLOOD: Lipase: 26 U/L (ref 11–51)

## 2015-12-19 LAB — TROPONIN I: Troponin I: <0.03 ng/mL (ref <0.031)

## 2015-12-19 MED ORDER — FAMOTIDINE 20 MG PO TABS
20.0000 mg | ORAL_TABLET | Freq: Once | ORAL | Status: AC
  Administered 2015-12-19: 20 mg via ORAL
  Filled 2015-12-19: qty 1

## 2015-12-19 MED ORDER — ONDANSETRON HCL 4 MG/2ML IJ SOLN: 4.0000 mg | Freq: Once | INTRAMUSCULAR | Status: DC

## 2015-12-19 MED ORDER — IOHEXOL 350 MG/ML SOLN
100.0000 mL | Freq: Once | INTRAVENOUS | Status: AC | PRN
  Administered 2015-12-19: 100 mL via INTRAVENOUS

## 2015-12-19 MED ORDER — OMEPRAZOLE MAGNESIUM 20 MG PO TBEC: 20.0000 mg | DELAYED_RELEASE_TABLET | Freq: Every day | ORAL | Status: DC

## 2015-12-19 MED ORDER — GI COCKTAIL ~~LOC~~
30.0000 mL | Freq: Once | ORAL | Status: AC
  Administered 2015-12-19: 30 mL via ORAL
  Filled 2015-12-19: qty 30

## 2015-12-19 MED ORDER — IOHEXOL 240 MG/ML SOLN
25.0000 mL | Freq: Once | INTRAMUSCULAR | Status: AC | PRN
  Administered 2015-12-19: 25 mL via ORAL

## 2015-12-19 NOTE — ED Provider Notes (Addendum)
[facility] Emergency Department Provider Note  ____________________________________________  Time seen: Approximately 3:04 PM  I have reviewed the triage vital signs and the nursing notes.   HISTORY  Chief Complaint Chest Pain  Caveat - ASL interpreter utilized.  HPI Judith Mitchell is a 65 y.o. female issue of deafness, hyperlipidemia, diabetes, hypertension, who presents for evaluation of burning epigastric pain radiating into the left chest that began yesterday evening, worse with lying down, not worse with exertion, not associated with any shortness of breath, constant since onset, currently mild. Pain is not pleuritic in nature. Pain is not ripping or tearing in nature does not radiate to the back or down towards the feet. Patient reports that over the past 2-3 days she has been sick with some nausea vomiting and diarrhea. She reports that the symptoms have improved, she is no longer vomiting but she is having epigastric pain. She has no history of coronary artery disease. No history of DVT or PE.   Past Medical History  Diagnosis Date  . Deaf   . High cholesterol   . Hypertension     There are no active problems to display for this patient.   Past Surgical History  Procedure Laterality Date  . Abdominal hysterectomy      Current Outpatient Rx  Name  Route  Sig  Dispense  Refill  . metFORMIN (GLUCOPHAGE-XR) 500 MG 24 hr tablet   Oral   Take 500 mg by mouth daily with supper.         . naproxen (NAPROSYN) 500 MG tablet   Oral   Take 500 mg by mouth 2 (two) times daily as needed for mild pain.          . simvastatin (ZOCOR) 40 MG tablet   Oral   Take 40 mg by mouth every evening.          . triamterene-hydrochlorothiazide (MAXZIDE-25) 37.5-25 MG tablet   Oral   Take 1 tablet by mouth every evening.            Allergies Review of patient's allergies indicates no known allergies.  No family history on file.  Social  History Social History  Substance Use Topics  . Smoking status: Never Smoker   . Smokeless tobacco: None  . Alcohol Use: None    Review of Systems Constitutional: No fever/chills Eyes: No visual changes. ENT: No sore throat. Cardiovascular: Denies chest pain. Respiratory: Denies shortness of breath. Gastrointestinal: epgastric pain.  + nausea, + vomiting.  + diarrhea.  No constipation. Genitourinary: Negative for dysuria. Musculoskeletal: Negative for back pain. Skin: Negative for rash. Neurological: Negative for headaches, focal weakness or numbness.  10-point ROS otherwise negative.  ____________________________________________   PHYSICAL EXAM:  VITAL SIGNS: ED Triage Vitals  Enc Vitals Group     BP 12/19/15 1048 157/83 mmHg     Pulse Rate 12/19/15 1048 79     Resp 12/19/15 1048 18     Temp 12/19/15 1048 97.6 F (36.4 C)     Temp Source 12/19/15 1048 Oral     SpO2 12/19/15 1048 99 %     Weight 12/19/15 1048 280 lb (127.007 kg)     Height 12/19/15 1048 5\' 5"  (1.651 m)     Head Cir --      Peak Flow --      Pain Score 12/19/15 1049 5     Pain Loc --      Pain Edu? --  Excl. in [place]? --     Constitutional: Alert and oriented. Well appearing and in no acute distress. Eyes: Conjunctivae are normal. PERRL. EOMI. Head: Atraumatic. Nose: No congestion/rhinnorhea. Mouth/Throat: Mucous membranes are moist.  Oropharynx non-erythematous. Neck: No stridor.  Cardiovascular: Normal rate, regular rhythm. Grossly normal heart sounds.  Good peripheral circulation. Respiratory: Normal respiratory effort.  No retractions. Lungs CTAB. Gastrointestinal: Soft with mild tenderness in the epigastrium, no rebound or guarding. No CVA tenderness. Genitourinary: deferred Musculoskeletal: No lower extremity tenderness nor edema.  No joint effusions. No calf swelling, tenderness or asymmetry. Neurologic:  Normal speech and language as communicated through interpreter. No gross focal  neurologic deficits are appreciated.  Skin:  Skin is warm, dry and intact. No rash noted. Psychiatric: Mood and affect are normal. Speech and behavior are normal.  ____________________________________________   LABS (all labs ordered are listed, but only abnormal results are displayed)  Labs Reviewed  BASIC METABOLIC PANEL - Abnormal; Notable for the following:    Potassium 3.2 (*)    Glucose, Bld 152 (*)    BUN 22 (*)    Anion gap 4 (*)    All other components within normal limits  CBC - Abnormal; Notable for the following:    Platelets 122 (*)    All other components within normal limits  HEPATIC FUNCTION PANEL - Abnormal; Notable for the following:    Total Protein 6.0 (*)    Albumin 3.2 (*)    All other components within normal limits  URINALYSIS COMPLETEWITH MICROSCOPIC ([facility] ONLY) - Abnormal; Notable for the following:    Color, Urine YELLOW (*)    APPearance HAZY (*)    Bacteria, UA RARE (*)    Squamous Epithelial / LPF 6-30 (*)    All other components within normal limits  TROPONIN I  LIPASE, BLOOD  TROPONIN I   ____________________________________________  EKG  ED ECG REPORT I, Joanne Gavel, the attending physician, personally viewed and interpreted this ECG.   Date: 12/19/2015  EKG Time: 10:37  Rate: 70  Rhythm: normal EKG, normal sinus rhythm  Axis: normal  Intervals:none  ST&T Change: No acute ST elevation. EKG is similar to 10/28/2013  ____________________________________________  RADIOLOGY  CXR IMPRESSION: No active cardiopulmonary disease.   RUQ ultrasound IMPRESSION: 1. Nonvisualized gallbladder. 2. Normal common bile duct diameter. 3. Hepatic steatosis.  CT abdomen and pelvis IMPRESSION: Stable borderline splenomegaly. No acute intra-abdominal pathology.    ____________________________________________   PROCEDURES  Procedure(s) performed: None  Critical Care performed:  No  ____________________________________________   INITIAL IMPRESSION / ASSESSMENT AND PLAN / ED COURSE  Pertinent labs & imaging results that were available during my care of the patient were reviewed by me and considered in my medical decision making (see chart for details).  Judith Mitchell is a 65 y.o. female issue of deafness, hyperlipidemia, diabetes, hypertension, who presents for evaluation of epigastric pain radiating into the left chest that began yesterday evening. On exam, she is very well-appearing and in no acute distress. Vital signs are stable, she is afebrile. She does have some tenderness to palpation in the epigastrium and I suspect her symptoms may be related to GERD/gastritis, possible acute gallbladder pathology in the setting of recent GI illness which is resolving. Her EKG is reassuring, initial troponin negative however will obtain second troponin. Chest x-ray clear. Pain is not pleuritic in nature, no clinical evidence of DVT, pain not ripping or tearing in nature and does not radiate towards the back or down  towards the feet therefore doubt pulmonary and was more acute aortic dissection. Plan for screening labs, right upper quadrant ultrasound, chest x-ray, urinalysis, reassess for disposition.  ----------------------------------------- 5:31 PM on 12/19/2015 -----------------------------------------  Gallbladder not visualized on [place] an patient is adamant that she has not had cholecystectomy. Will obtain CT abdomen and pelvis given abdominal tenderness and age.  ----------------------------------------- 8:01 PM on 12/19/2015 ----------------------------------------- CT abdomen and pelvis is negative. CT is read as "postcholecystectomy" though the patient denies history of cholecystectomy. She is sitting up in bed, alert, in no acute distress. She is eating a [place] sandwich. Second troponin is negative. I doubt ACS. Suspect a GI cause of her pain such as GERD,  gastritis, possible nonbleeding nonperforated peptic ulcer. Symptoms improved with Pepcid. We'll DC with omeprazole. We discussed return precautions, need for close PCP follow-up and she is comfortable with the discharge plan. DC home.  ____________________________________________   FINAL CLINICAL IMPRESSION(S) / ED DIAGNOSES  Final diagnoses:  Pain  Acute epigastric pain      Joanne Gavel, MD 12/19/15 2002  Joanne Gavel, MD 12/19/15 2004

## 2015-12-19 NOTE — ED Notes (Signed)
Esignature not working at this time. Unable to obtain signature. Pt given discharge instructions.

## 2015-12-19 NOTE — ED Notes (Signed)
Pts own interpreter service called and wanted to know if they were needed to assist with patient, they were advised that we provided available and appropriate services.

## 2015-12-19 NOTE — ED Notes (Signed)
Pt from [facility] with reports of central chest pain that began this am. Pt is deaf and only uses sign language, refusing to use interpreter on video, wants in person. Pt was triaged by writing info. Pt denies sob.

## 2015-12-19 NOTE — ED Notes (Signed)
Interpreter services used Judith Mitchell [phone] with american sign language. Pt was not satisfied with using provided services and was wanting [place] to provide in person services. Pt was advised that the interpreter provided was an approved service.

## 2016-01-01 DIAGNOSIS — R1013 Epigastric pain: Secondary | ICD-10-CM | POA: Diagnosis not present

## 2016-01-16 DIAGNOSIS — R509 Fever, unspecified: Secondary | ICD-10-CM | POA: Diagnosis not present

## 2016-01-16 DIAGNOSIS — J019 Acute sinusitis, unspecified: Secondary | ICD-10-CM | POA: Diagnosis not present

## 2016-01-16 DIAGNOSIS — R1013 Epigastric pain: Secondary | ICD-10-CM | POA: Diagnosis not present

## 2016-01-16 DIAGNOSIS — B9689 Other specified bacterial agents as the cause of diseases classified elsewhere: Secondary | ICD-10-CM | POA: Diagnosis not present

## 2016-01-19 DIAGNOSIS — R1013 Epigastric pain: Secondary | ICD-10-CM | POA: Diagnosis not present

## 2016-01-19 DIAGNOSIS — R5383 Other fatigue: Secondary | ICD-10-CM | POA: Diagnosis not present

## 2016-01-26 ENCOUNTER — Encounter: Payer: Self-pay | Admitting: *Deleted

## 2016-01-29 ENCOUNTER — Encounter
Admission: RE | Disposition: A | Discharge status: Home / Self Care | Payer: Self-pay | Source: Ambulatory Visit | Attending: Gastroenterology

## 2016-01-29 ENCOUNTER — Encounter: Payer: Self-pay | Admitting: *Deleted

## 2016-01-29 ENCOUNTER — Ambulatory Visit
Admission: RE | Admit: 2016-01-29 | Discharge: 2016-01-29 | Disposition: A | Discharge status: Home / Self Care | Payer: PPO | Source: Ambulatory Visit | Attending: Gastroenterology | Admitting: Gastroenterology

## 2016-01-29 ENCOUNTER — Ambulatory Visit: Payer: PPO | Admitting: Anesthesiology

## 2016-01-29 DIAGNOSIS — Z6841 Body Mass Index (BMI) 40.0 and over, adult: Secondary | ICD-10-CM | POA: Insufficient documentation

## 2016-01-29 DIAGNOSIS — K210 Gastro-esophageal reflux disease with esophagitis: Secondary | ICD-10-CM | POA: Diagnosis not present

## 2016-01-29 DIAGNOSIS — E7800 Pure hypercholesterolemia, unspecified: Secondary | ICD-10-CM | POA: Insufficient documentation

## 2016-01-29 DIAGNOSIS — K2960 Other gastritis without bleeding: Secondary | ICD-10-CM | POA: Diagnosis not present

## 2016-01-29 DIAGNOSIS — Z79899 Other long term (current) drug therapy: Secondary | ICD-10-CM | POA: Insufficient documentation

## 2016-01-29 DIAGNOSIS — K2950 Unspecified chronic gastritis without bleeding: Secondary | ICD-10-CM | POA: Insufficient documentation

## 2016-01-29 DIAGNOSIS — R1013 Epigastric pain: Secondary | ICD-10-CM | POA: Insufficient documentation

## 2016-01-29 DIAGNOSIS — Z7984 Long term (current) use of oral hypoglycemic drugs: Secondary | ICD-10-CM | POA: Insufficient documentation

## 2016-01-29 DIAGNOSIS — E669 Obesity, unspecified: Secondary | ICD-10-CM | POA: Diagnosis not present

## 2016-01-29 DIAGNOSIS — K3189 Other diseases of stomach and duodenum: Secondary | ICD-10-CM | POA: Diagnosis not present

## 2016-01-29 DIAGNOSIS — K2970 Gastritis, unspecified, without bleeding: Secondary | ICD-10-CM | POA: Diagnosis not present

## 2016-01-29 DIAGNOSIS — H9190 Unspecified hearing loss, unspecified ear: Secondary | ICD-10-CM | POA: Diagnosis not present

## 2016-01-29 DIAGNOSIS — I10 Essential (primary) hypertension: Secondary | ICD-10-CM | POA: Insufficient documentation

## 2016-01-29 HISTORY — PX: ESOPHAGOGASTRODUODENOSCOPY (EGD) WITH PROPOFOL: SHX5813

## 2016-01-29 HISTORY — DX: Obesity, unspecified: E66.9

## 2016-01-29 HISTORY — DX: Cellulitis, unspecified: L03.90

## 2016-01-29 LAB — GLUCOSE, CAPILLARY: GLUCOSE-CAPILLARY: 128 mg/dL — AB (ref 65–99)

## 2016-01-29 SURGERY — ESOPHAGOGASTRODUODENOSCOPY (EGD) WITH PROPOFOL
Anesthesia: General

## 2016-01-29 MED ORDER — MIDAZOLAM HCL 5 MG/5ML IJ SOLN
INTRAMUSCULAR | Status: DC | PRN
  Administered 2016-01-29: 1 mg via INTRAVENOUS

## 2016-01-29 MED ORDER — IPRATROPIUM-ALBUTEROL 0.5-2.5 (3) MG/3ML IN SOLN
RESPIRATORY_TRACT | Status: AC
  Administered 2016-01-29: 3 mL
  Filled 2016-01-29: qty 3

## 2016-01-29 MED ORDER — PROPOFOL 500 MG/50ML IV EMUL
INTRAVENOUS | Status: DC | PRN
  Administered 2016-01-29: 140 ug/kg/min via INTRAVENOUS

## 2016-01-29 MED ORDER — PROPOFOL 10 MG/ML IV BOLUS
INTRAVENOUS | Status: DC | PRN
  Administered 2016-01-29: 40 mg via INTRAVENOUS
  Administered 2016-01-29 (×2): 10 mg via INTRAVENOUS

## 2016-01-29 MED ORDER — SODIUM CHLORIDE 0.9 % IV SOLN
INTRAVENOUS | Status: DC
  Administered 2016-01-29: 14:00:00 via INTRAVENOUS

## 2016-01-29 MED ORDER — FENTANYL CITRATE (PF) 100 MCG/2ML IJ SOLN
25.0000 ug | INTRAMUSCULAR | Status: DC | PRN
  Administered 2016-01-29: 50 ug via INTRAVENOUS

## 2016-01-29 MED ORDER — ONDANSETRON HCL 4 MG/2ML IJ SOLN: 4.0000 mg | Freq: Once | INTRAMUSCULAR | Status: DC | PRN

## 2016-01-29 MED ORDER — LIDOCAINE HCL (CARDIAC) 20 MG/ML IV SOLN
INTRAVENOUS | Status: DC | PRN
  Administered 2016-01-29: 100 mg via INTRAVENOUS

## 2016-01-29 NOTE — Transfer of Care (Signed)
Immediate Anesthesia Transfer of Care Note  Patient: Judith Mitchell  Procedure(s) Performed: Procedure(s): ESOPHAGOGASTRODUODENOSCOPY (EGD) WITH PROPOFOL (N/A)    Patient Location: PACU  Anesthesia Type:General  Level of Consciousness: sedated  Airway & Oxygen Therapy: Patient Spontanous Breathing and Patient connected to nasal cannula oxygen  Post-op Assessment: Report given to RN and Post -op Vital signs reviewed and stable  Post vital signs: Reviewed and stable  Last Vitals:  Filed Vitals:   01/29/16 1342 01/29/16 1449  BP: 136/104 146/69  Pulse: 78 77  Temp: 35.9 C 36.6 C  Resp: 18 13    Complications: No apparent anesthesia complications

## 2016-01-29 NOTE — Anesthesia Preprocedure Evaluation (Signed)
Anesthesia Evaluation  Patient identified by MRN, date of birth, ID band Patient awake    Reviewed: Allergy & Precautions, NPO status , Patient's Chart, lab work & pertinent test results  History of Anesthesia Complications Negative for: history of anesthetic complications  Airway Mallampati: II       Dental   Pulmonary neg pulmonary ROS,           Cardiovascular hypertension, Pt. on medications      Neuro/Psych negative neurological ROS     GI/Hepatic Neg liver ROS, GERD  Medicated and Poorly Controlled,  Endo/Other  negative endocrine ROSdiabetes, Oral Hypoglycemic Agents  Renal/GU negative Renal ROS     Musculoskeletal   Abdominal   Peds  Hematology   Anesthesia Other Findings   Reproductive/Obstetrics                             Anesthesia Physical Anesthesia Plan  ASA: II  Anesthesia Plan: General   Post-op Pain Management:    Induction: Intravenous  Airway Management Planned: Nasal Cannula  Additional Equipment:   Intra-op Plan:   Post-operative Plan:   Informed Consent: I have reviewed the patients History and Physical, chart, labs and discussed the procedure including the risks, benefits and alternatives for the proposed anesthesia with the patient or authorized representative who has indicated his/her understanding and acceptance.     Plan Discussed with:   Anesthesia Plan Comments:         Anesthesia Quick Evaluation

## 2016-01-29 NOTE — H&P (Signed)
Outpatient short stay form Pre-procedure 01/29/2016 2:08 PM Judith Sails MD  Primary Physician: Dr. Charlestine Massed  Reason for visit:  EGD  History of present illness:  Patient is a 65 year old female presenting today for EGD in regards to history of epigastric abdominal pain. She only irregularly takes medications such as a proton pump inhibitor or Carafate. She was taking Aleve up until about a week ago. She has been taking NSAIDs chronically for some time. He denies any black stools. The pain is in the upper epigastric region and indeed across both sides of epigastric region.    Current facility-administered medications:  .  0.9 %  sodium chloride infusion, , Intravenous, Continuous, Judith Sails, MD .  0.9 %  sodium chloride infusion, , Intravenous, Continuous, Judith Sails, MD, Last Rate: 20 mL/hr at 01/29/16 1408 .  fentaNYL (SUBLIMAZE) injection 25 mcg, 25 mcg, Intravenous, Q5 min PRN, Gunnar Fusi, MD .  ondansetron [facility]) injection 4 mg, 4 mg, Intravenous, Once PRN, Gunnar Fusi, MD  Prescriptions prior to admission  Medication Sig Dispense Refill Last Dose  . metFORMIN (GLUCOPHAGE-XR) 500 MG 24 hr tablet Take 500 mg by mouth daily with supper.   01/28/2016 at Unknown time  . omeprazole (PRILOSEC OTC) 20 MG tablet Take 1 tablet (20 mg total) by mouth daily. 30 tablet 0 01/28/2016 at Unknown time  . simvastatin (ZOCOR) 40 MG tablet Take 40 mg by mouth every evening.    01/28/2016 at Unknown time  . triamterene-hydrochlorothiazide (MAXZIDE-25) 37.5-25 MG tablet Take 1 tablet by mouth every evening.    01/29/2016 at 0900  . naproxen (NAPROSYN) 500 MG tablet Take 500 mg by mouth 2 (two) times daily as needed for mild pain. Reported on 01/29/2016   Not Taking at Unknown time     No Known Allergies   Past Medical History  Diagnosis Date  . Deaf   . High cholesterol   . Hypertension   . Obesity   . Cellulitis     Review of systems:      Physical Exam   Heart and lungs: Regular rate and rhythm without rub or gallop, lungs are bilaterally clear.    HEENT: Normocephalic atraumatic eyes are anicteric    Other:     Pertinant exam for procedure: Soft positive tender to palpation in the epigastrium and somewhat to the medial left and right upper quadrants. No masses or rebound. Bowel sounds are positive and normoactive.    Planned proceedures: EGD and indicated procedures. I have discussed the risks benefits and complications of procedures to include not limited to bleeding, infection, perforation and the risk of sedation and the patient wishes to proceed.    Judith Sails, MD Gastroenterology 01/29/2016  2:08 PM

## 2016-01-29 NOTE — Op Note (Signed)
[facility] Gastroenterology Patient Name: Judith Mitchell Procedure Date: 01/29/2016 2:15 PM MRN: [id number] Account #: [id number] Date of Birth: 1950-11-03 Admit Type: Outpatient Age: 65 Room: [facility] ENDO ROOM 2 Gender: Female Note Status: Finalized Procedure:            Upper GI endoscopy Indications:          Epigastric abdominal pain Providers:            Lollie Sails, MD Referring MD:         Irven Easterly. Kary Kos, MD (Referring MD) Medicines:            Monitored Anesthesia Care Complications:        No immediate complications. Procedure:            Pre-Anesthesia Assessment:                       - ASA Grade Assessment: II - A patient with mild                        systemic disease.                       After obtaining informed consent, the endoscope was                        passed under direct vision. Throughout the procedure,                        the patient's blood pressure, pulse, and oxygen                        saturations were monitored continuously. The Endoscope                        was introduced through the mouth, and advanced to the                        third part of duodenum. The upper GI endoscopy was                        accomplished without difficulty. The patient tolerated                        the procedure well. Findings:      The Z-line was variable. Biopsies were taken with a cold forceps for       histology.      The exam of the esophagus was otherwise normal.      Diffuse minimal inflammation characterized by congestion (edema) and       erythema was found in the gastric body and in the gastric antrum. This       was biopsied with a cold forceps for histology and Helicobacter pylori       testing.      The cardia and gastric fundus were normal on retroflexion.      The examined duodenum was normal. Impression:           - Z-line variable. Biopsied.                       - Gastritis. Biopsied.                       -  Normal examined duodenum. Recommendation:       - Await pathology results.                       - Use Protonix (pantoprazole) 40 mg PO daily daily.                       - Use sucralfate tablets 1 gram PO BID daily. Procedure Code(s):    --- Professional ---                       [phone], Esophagogastroduodenoscopy, flexible, transoral;                        with biopsy, single or multiple Diagnosis Code(s):    --- Professional ---                       K22.8, Other specified diseases of esophagus                       K29.70, Gastritis, unspecified, without bleeding                       R10.13, Epigastric pain CPT copyright 2016 American Medical Association. All rights reserved. The codes documented in this report are preliminary and upon coder review may  be revised to meet current compliance requirements. Lollie Sails, MD 01/29/2016 2:37:54 PM This report has been signed electronically. Number of Addenda: 0 Note Initiated On: 01/29/2016 2:15 PM      [facility]

## 2016-01-30 ENCOUNTER — Emergency Department
Admission: EM | Admit: 2016-01-30 | Discharge: 2016-01-30 | Disposition: A | Discharge status: Home / Self Care | Payer: PPO | Attending: Emergency Medicine | Admitting: Emergency Medicine

## 2016-01-30 ENCOUNTER — Emergency Department: Payer: PPO

## 2016-01-30 ENCOUNTER — Encounter: Payer: Self-pay | Admitting: Emergency Medicine

## 2016-01-30 DIAGNOSIS — R05 Cough: Secondary | ICD-10-CM

## 2016-01-30 DIAGNOSIS — R079 Chest pain, unspecified: Secondary | ICD-10-CM | POA: Diagnosis not present

## 2016-01-30 DIAGNOSIS — R1013 Epigastric pain: Secondary | ICD-10-CM | POA: Diagnosis not present

## 2016-01-30 DIAGNOSIS — I10 Essential (primary) hypertension: Secondary | ICD-10-CM | POA: Diagnosis not present

## 2016-01-30 DIAGNOSIS — R197 Diarrhea, unspecified: Secondary | ICD-10-CM | POA: Diagnosis not present

## 2016-01-30 DIAGNOSIS — Z79899 Other long term (current) drug therapy: Secondary | ICD-10-CM | POA: Insufficient documentation

## 2016-01-30 DIAGNOSIS — N390 Urinary tract infection, site not specified: Secondary | ICD-10-CM

## 2016-01-30 DIAGNOSIS — E669 Obesity, unspecified: Secondary | ICD-10-CM | POA: Diagnosis not present

## 2016-01-30 DIAGNOSIS — R059 Cough, unspecified: Secondary | ICD-10-CM

## 2016-01-30 LAB — CBC
HEMATOCRIT: 36.7 % (ref 35.0–47.0)
Hemoglobin: 12.8 g/dL (ref 12.0–16.0)
MCH: 28.1 pg (ref 26.0–34.0)
MCHC: 34.8 g/dL (ref 32.0–36.0)
MCV: 80.9 fL (ref 80.0–100.0)
PLATELETS: 164 10*3/uL (ref 150–440)
RBC: 4.54 MIL/uL (ref 3.80–5.20)
RDW: 14.2 % (ref 11.5–14.5)
WBC: 4.4 10*3/uL (ref 3.6–11.0)

## 2016-01-30 LAB — URINALYSIS COMPLETE WITH MICROSCOPIC (ARMC ONLY)
Bilirubin Urine: NEGATIVE
GLUCOSE, UA: NEGATIVE mg/dL
Hgb urine dipstick: NEGATIVE
KETONES UR: NEGATIVE mg/dL
NITRITE: NEGATIVE
PROTEIN: NEGATIVE mg/dL
SPECIFIC GRAVITY, URINE: 1.021 (ref 1.005–1.030)
pH: 6 (ref 5.0–8.0)

## 2016-01-30 LAB — BASIC METABOLIC PANEL
Anion gap: 3 — ABNORMAL LOW (ref 5–15)
BUN: 13 mg/dL (ref 6–20)
CHLORIDE: 112 mmol/L — AB (ref 101–111)
CO2: 28 mmol/L (ref 22–32)
CREATININE: 0.69 mg/dL (ref 0.44–1.00)
Calcium: 8.9 mg/dL (ref 8.9–10.3)
GFR calc Af Amer: >60 mL/min (ref >60)
GFR calc non Af Amer: >60 mL/min (ref >60)
GLUCOSE: 155 mg/dL — AB (ref 65–99)
POTASSIUM: 2.9 mmol/L — AB (ref 3.5–5.1)
Sodium: 143 mmol/L (ref 135–145)

## 2016-01-30 LAB — TROPONIN I: Troponin I: <0.03 ng/mL (ref <0.031)

## 2016-01-30 MED ORDER — DIPHENOXYLATE-ATROPINE 2.5-0.025 MG PO TABS
ORAL_TABLET | ORAL | Status: AC
  Administered 2016-01-30: 1 via ORAL
  Filled 2016-01-30: qty 1

## 2016-01-30 MED ORDER — DIPHENOXYLATE-ATROPINE 2.5-0.025 MG PO TABS
1.0000 | ORAL_TABLET | Freq: Once | ORAL | Status: AC
  Administered 2016-01-30: 1 via ORAL

## 2016-01-30 MED ORDER — GI COCKTAIL ~~LOC~~
30.0000 mL | Freq: Once | ORAL | Status: AC
  Administered 2016-01-30: 30 mL via ORAL
  Filled 2016-01-30 (×2): qty 30

## 2016-01-30 MED ORDER — POTASSIUM CHLORIDE 10 MEQ/100ML IV SOLN: 10.0000 meq | Freq: Once | INTRAVENOUS | Status: DC

## 2016-01-30 MED ORDER — CEPHALEXIN 500 MG PO CAPS: 500.0000 mg | ORAL_CAPSULE | Freq: Four times a day (QID) | ORAL | Status: AC

## 2016-01-30 MED ORDER — LOPERAMIDE HCL 2 MG PO TABS: 2.0000 mg | ORAL_TABLET | Freq: Four times a day (QID) | ORAL | Status: DC | PRN

## 2016-01-30 MED ORDER — OMEPRAZOLE 40 MG PO CPDR: 40.0000 mg | DELAYED_RELEASE_CAPSULE | Freq: Every day | ORAL | Status: DC

## 2016-01-30 MED ORDER — POTASSIUM CHLORIDE CRYS ER 20 MEQ PO TBCR
40.0000 meq | EXTENDED_RELEASE_TABLET | Freq: Once | ORAL | Status: AC
  Administered 2016-01-30: 40 meq via ORAL
  Filled 2016-01-30 (×2): qty 2

## 2016-01-30 NOTE — ED Notes (Signed)
Patient presents to the ED with burning in her chest/epigastric area that radiates into her back.  Patient is also complaining of abdominal pain and watery yellow diarrhea.  Patient states symptoms started on March 16th.  Patient had an endoscopy yesterday and the physician's office called her and told her to come to the ED due to her chest pain.  Patient reports diarrhea x 2 today.  Denies vomiting.

## 2016-01-30 NOTE — Anesthesia Postprocedure Evaluation (Signed)
Anesthesia Post Note  Patient: Judith Mitchell  Procedure(s) Performed: Procedure(s) (LRB): ESOPHAGOGASTRODUODENOSCOPY (EGD) WITH PROPOFOL (N/A)  Patient location during evaluation: Endoscopy Anesthesia Type: General Level of consciousness: awake and alert Pain management: pain level controlled Vital Signs Assessment: post-procedure vital signs reviewed and stable Respiratory status: spontaneous breathing and respiratory function stable Cardiovascular status: stable Anesthetic complications: no    Last Vitals:  Filed Vitals:   01/29/16 1519 01/29/16 1529  BP: 174/63 131/98  Pulse: 78 64  Temp:    Resp: 24 14    Last Pain:  Filed Vitals:   01/29/16 1533  PainSc: Asleep                 KEPHART,WILLIAM K

## 2016-01-30 NOTE — ED Provider Notes (Signed)
[facility] Emergency Department Provider Note  ____________________________________________  Time seen: Approximately 2:28 PM  I have reviewed the triage vital signs and the nursing notes.   HISTORY  Chief Complaint Chest Pain  Patient is deaf in the entirety of our history taking is done by writing notes to each other. I spent an extensive amount of time with the patient making sure that we had covered all the important topics.   HPI Judith Mitchell is a 65 y.o. female w/ a hx of HTN, HL, and DM presenting w/ epigastric and chest pain, chronic dry cough, and diarrhea. Patient reports that for the last 3-4 weeks she has had multiple daily episodes of yellow diarrhea. This is not associated with lightheadedness or shortness breath, syncope, fever or chills, nausea or vomiting. She has not been capping outdoors nor been outside of the [place]. She has also had a sharp epigastric and chest pain that is associated with a chronic dry cough and worse at night.   Past Medical History  Diagnosis Date  . Deaf   . High cholesterol   . Hypertension   . Obesity   . Cellulitis     There are no active problems to display for this patient.   Past Surgical History  Procedure Laterality Date  . Abdominal hysterectomy    . Cholecystectomy    . Esophagogastroduodenoscopy (egd) with propofol N/A 01/29/2016    Procedure: ESOPHAGOGASTRODUODENOSCOPY (EGD) WITH PROPOFOL;  Surgeon: Lollie Sails, MD;  Location: [facility] ENDOSCOPY;  Service: Endoscopy;  Laterality: N/A;    Current Outpatient Rx  Name  Route  Sig  Dispense  Refill  . metFORMIN (GLUCOPHAGE-XR) 500 MG 24 hr tablet   Oral   Take 500 mg by mouth daily with supper.         . naproxen (NAPROSYN) 500 MG tablet   Oral   Take 500 mg by mouth 2 (two) times daily as needed for mild pain. Reported on 01/29/2016         . omeprazole (PRILOSEC OTC) 20 MG tablet   Oral   Take 1 tablet (20 mg total) by mouth  daily.   30 tablet   0   . simvastatin (ZOCOR) 40 MG tablet   Oral   Take 40 mg by mouth every evening.          . triamterene-hydrochlorothiazide (MAXZIDE-25) 37.5-25 MG tablet   Oral   Take 1 tablet by mouth every evening.            Allergies Review of patient's allergies indicates no known allergies.  No family history on file.  Social History Social History  Substance Use Topics  . Smoking status: Never Smoker   . Smokeless tobacco: Never Used  . Alcohol Use: No    Review of Systems Constitutional: No fever/chills. No lightheadedness or syncope. Eyes: No visual changes. ENT: No sore throat. No congestion or rhinorrhea. Cardiovascular: Positive chest pain. Denies palpitations. Respiratory: Denies shortness of breath.  Positive cough. Gastrointestinal: Positive epigastric abdominal pain.  No nausea, no vomiting.  Positive yellow diarrhea.  No constipation. Genitourinary: Negative for dysuria. Musculoskeletal: Negative for back pain. Skin: Negative for rash. Neurological: Negative for headaches. No focal numbness, tingling or weakness.   10-point ROS otherwise negative.  ____________________________________________   PHYSICAL EXAM:  VITAL SIGNS: ED Triage Vitals  Enc Vitals Group     BP 01/30/16 1123 185/98 mmHg     Pulse Rate 01/30/16 1123 78  Resp 01/30/16 1123 20     Temp 01/30/16 1123 98.9 F (37.2 C)     Temp Source 01/30/16 1123 Oral     SpO2 01/30/16 1123 95 %     Weight 01/30/16 1123 280 lb (127.007 kg)     Height 01/30/16 1123 5\' 5"  (1.651 m)     Head Cir --      Peak Flow --      Pain Score 01/30/16 1124 7     Pain Loc --      Pain Edu? --      Excl. in [place]? --     Constitutional: Patient is deaf. Alert and oriented. Well appearing and in no acute distress. Answers questions appropriately. Eyes: Conjunctivae are normal.  EOMI. No scleral icterus. Head: Atraumatic. Nose: No congestion/rhinnorhea. Mouth/Throat: Mucous membranes are  moist.  Neck: No stridor.  Supple.  No JVD. No meningismus. Cardiovascular: Normal rate, regular rhythm. No murmurs, rubs or gallops.  Respiratory: Normal respiratory effort.  No accessory muscle use or retractions. Lungs CTAB.  No wheezes, rales or ronchi. Gastrointestinal: Soft and nondistended.  Mild epigastric tenderness to palpation that reproduces the patient's pain . No guarding or rebound.  No peritoneal signs. Musculoskeletal: No LE edema. No ttp in the calves or palpable cords.  Negative Homan's sign. Neurologic:  A&Ox3.  Speech is clear.  Face and smile are symmetric.  EOMI.  Moves all extremities well. Skin:  Skin is warm, dry and intact. No rash noted. Psychiatric: Mood and affect are normal. Speech and behavior are normal.  Normal judgement.  ____________________________________________   LABS (all labs ordered are listed, but only abnormal results are displayed)  Labs Reviewed  BASIC METABOLIC PANEL - Abnormal; Notable for the following:    Potassium 2.9 (*)    Chloride 112 (*)    Glucose, Bld 155 (*)    Anion gap 3 (*)    All other components within normal limits  CBC  TROPONIN I   ____________________________________________  EKG  ED ECG REPORT I, Eula Listen, the attending physician, personally viewed and interpreted this ECG.   Date: 01/30/2016  EKG Time: 1118  Rate: 69  Rhythm: normal sinus rhythm  Axis: Normal  Intervals:none  ST&T Change: No ST changes  ____________________________________________  RADIOLOGY  Dg Chest 2 View  01/30/2016  CLINICAL DATA:  Chest pain EXAM: CHEST  2 VIEW COMPARISON:  12/19/2015 FINDINGS: Normal heart size and mediastinal contours. No acute infiltrate or edema. No effusion or pneumothorax. No acute osseous findings. IMPRESSION: No acute finding or change from prior. Electronically Signed   By: Monte Fantasia M.D.   On: 01/30/2016 12:34     ____________________________________________   PROCEDURES  Procedure(s) performed: None  Critical Care performed: No ____________________________________________   INITIAL IMPRESSION / ASSESSMENT AND PLAN / ED COURSE  Pertinent labs & imaging results that were available during my care of the patient were reviewed by me and considered in my medical decision making (see chart for details).  65 y.o. female with a history of HTN, HL, obesity and deafness presenting with 4 weeks of a dry cough associated with epigastric and chest pain that is worse at night. This sounds typical of reflux disease and that is probably driving her cough. I will plan to treat her with omeprazole as an outpatient and a GI cocktail here. The diarrhea does not necessarily match the acid reflux, but I will treat her with an antidiarrheal she has not tried any medications either for  her discomfort nor for her symptoms. Overall, the patient is chronically ill appearing but has stable vital signs and a reassuring abdominal examination. She will follow-up with her primary care physician in the next 2-3 days. She understood return precautions as well as follow-up instructions.  ----------------------------------------- 5:22 PM on 01/30/2016 -----------------------------------------  The patient does have a urinary tract infection, so I will send her home with 5 days of Keflex. She'll follow-up with her primary care physician. She understands return precautions as well as follow-up instructions.  ____________________________________________  FINAL CLINICAL IMPRESSION(S) / ED DIAGNOSES  Final diagnoses:  None      NEW MEDICATIONS STARTED DURING THIS VISIT:  New Prescriptions   No medications on file     Eula Listen, MD 01/30/16 1722

## 2016-01-30 NOTE — Discharge Instructions (Signed)
Please follow the bland BRAT diet as described for the next 3 days.  Then take dietary precautions as described in the GERD diet.  Please take the omeprazole daily, even if you are not having symptoms.  Please make a follow up appointment with your primary care doctor.  Please return to the emergency department for severe pain, shortness of breath, fever, lightheadedness or fainting or any other symptoms concerning to you.

## 2016-01-31 LAB — SURGICAL PATHOLOGY

## 2016-02-12 DIAGNOSIS — E785 Hyperlipidemia, unspecified: Secondary | ICD-10-CM | POA: Diagnosis not present

## 2016-02-12 DIAGNOSIS — R05 Cough: Secondary | ICD-10-CM | POA: Diagnosis not present

## 2016-02-12 DIAGNOSIS — I10 Essential (primary) hypertension: Secondary | ICD-10-CM | POA: Diagnosis not present

## 2016-02-12 DIAGNOSIS — R1013 Epigastric pain: Secondary | ICD-10-CM | POA: Diagnosis not present

## 2016-02-26 DIAGNOSIS — L03115 Cellulitis of right lower limb: Secondary | ICD-10-CM | POA: Diagnosis not present

## 2016-02-26 DIAGNOSIS — I10 Essential (primary) hypertension: Secondary | ICD-10-CM | POA: Diagnosis not present

## 2016-02-26 DIAGNOSIS — R05 Cough: Secondary | ICD-10-CM | POA: Diagnosis not present

## 2016-03-08 ENCOUNTER — Other Ambulatory Visit: Payer: Self-pay | Admitting: Gastroenterology

## 2016-03-08 DIAGNOSIS — R05 Cough: Secondary | ICD-10-CM | POA: Diagnosis not present

## 2016-03-08 DIAGNOSIS — R197 Diarrhea, unspecified: Secondary | ICD-10-CM | POA: Diagnosis not present

## 2016-03-08 DIAGNOSIS — R1011 Right upper quadrant pain: Secondary | ICD-10-CM

## 2016-03-09 ENCOUNTER — Other Ambulatory Visit
Admission: RE | Admit: 2016-03-09 | Discharge: 2016-03-09 | Disposition: A | Discharge status: Home / Self Care | Payer: PPO | Source: Ambulatory Visit | Attending: Gastroenterology | Admitting: Gastroenterology

## 2016-03-09 DIAGNOSIS — R197 Diarrhea, unspecified: Secondary | ICD-10-CM | POA: Diagnosis not present

## 2016-03-09 LAB — C DIFFICILE QUICK SCREEN W PCR REFLEX
C DIFFICILE (CDIFF) INTERP: NEGATIVE
C DIFFICILE (CDIFF) TOXIN: NEGATIVE
C DIFFICLE (CDIFF) ANTIGEN: NEGATIVE

## 2016-03-11 DIAGNOSIS — I10 Essential (primary) hypertension: Secondary | ICD-10-CM | POA: Diagnosis not present

## 2016-03-11 LAB — GASTROINTESTINAL PANEL BY PCR, STOOL (REPLACES STOOL CULTURE)
ASTROVIRUS: NOT DETECTED
Adenovirus F40/41: NOT DETECTED
CAMPYLOBACTER SPECIES: NOT DETECTED
Cryptosporidium: NOT DETECTED
Cyclospora cayetanensis: NOT DETECTED
E. coli O157: NOT DETECTED
ENTAMOEBA HISTOLYTICA: NOT DETECTED
ENTEROAGGREGATIVE E COLI (EAEC): NOT DETECTED
ENTEROPATHOGENIC E COLI (EPEC): NOT DETECTED
ENTEROTOXIGENIC E COLI (ETEC): NOT DETECTED
GIARDIA LAMBLIA: NOT DETECTED
NOROVIRUS GI/GII: NOT DETECTED
PLESIMONAS SHIGELLOIDES: NOT DETECTED
Rotavirus A: NOT DETECTED
Salmonella species: NOT DETECTED
Sapovirus (I, II, IV, and V): NOT DETECTED
Shiga like toxin producing E coli (STEC): NOT DETECTED
Shigella/Enteroinvasive E coli (EIEC): NOT DETECTED
VIBRIO CHOLERAE: NOT DETECTED
Vibrio species: NOT DETECTED
Yersinia enterocolitica: NOT DETECTED

## 2016-03-15 ENCOUNTER — Other Ambulatory Visit: Payer: Self-pay | Admitting: Gastroenterology

## 2016-03-15 ENCOUNTER — Ambulatory Visit
Admission: RE | Admit: 2016-03-15 | Discharge: 2016-03-15 | Disposition: A | Discharge status: Home / Self Care | Payer: PPO | Source: Ambulatory Visit | Attending: Gastroenterology | Admitting: Gastroenterology

## 2016-03-15 DIAGNOSIS — R1011 Right upper quadrant pain: Secondary | ICD-10-CM

## 2016-03-15 DIAGNOSIS — K760 Fatty (change of) liver, not elsewhere classified: Secondary | ICD-10-CM | POA: Insufficient documentation

## 2016-03-15 DIAGNOSIS — Z9049 Acquired absence of other specified parts of digestive tract: Secondary | ICD-10-CM | POA: Diagnosis not present

## 2016-03-15 MED ORDER — TECHNETIUM TC 99M MEBROFENIN IV KIT
5.0000 | PACK | Freq: Once | INTRAVENOUS | Status: AC | PRN
  Administered 2016-03-15: 5.38 via INTRAVENOUS

## 2016-03-26 ENCOUNTER — Other Ambulatory Visit: Payer: Self-pay | Admitting: Family Medicine

## 2016-03-26 DIAGNOSIS — B3789 Other sites of candidiasis: Secondary | ICD-10-CM | POA: Diagnosis not present

## 2016-03-26 DIAGNOSIS — L259 Unspecified contact dermatitis, unspecified cause: Secondary | ICD-10-CM | POA: Diagnosis not present

## 2016-03-26 DIAGNOSIS — Z1231 Encounter for screening mammogram for malignant neoplasm of breast: Secondary | ICD-10-CM

## 2016-03-26 DIAGNOSIS — R05 Cough: Secondary | ICD-10-CM | POA: Diagnosis not present

## 2016-03-26 DIAGNOSIS — I10 Essential (primary) hypertension: Secondary | ICD-10-CM | POA: Diagnosis not present

## 2016-04-09 ENCOUNTER — Ambulatory Visit: Payer: PPO

## 2016-04-10 ENCOUNTER — Ambulatory Visit: Payer: PPO | Attending: Family Medicine

## 2016-04-29 ENCOUNTER — Ambulatory Visit: Admission: RE | Admit: 2016-04-29 | Payer: PPO | Source: Ambulatory Visit

## 2016-06-06 DIAGNOSIS — M545 Low back pain: Secondary | ICD-10-CM | POA: Diagnosis not present

## 2016-06-06 DIAGNOSIS — S3992XA Unspecified injury of lower back, initial encounter: Secondary | ICD-10-CM | POA: Diagnosis not present

## 2016-06-06 DIAGNOSIS — Y92099 Unspecified place in other non-institutional residence as the place of occurrence of the external cause: Secondary | ICD-10-CM | POA: Diagnosis not present

## 2016-06-06 DIAGNOSIS — W19XXXA Unspecified fall, initial encounter: Secondary | ICD-10-CM | POA: Diagnosis not present

## 2016-06-11 ENCOUNTER — Other Ambulatory Visit: Payer: Self-pay | Admitting: Family Medicine

## 2016-06-11 ENCOUNTER — Ambulatory Visit
Admission: RE | Admit: 2016-06-11 | Discharge: 2016-06-11 | Disposition: A | Discharge status: Home / Self Care | Payer: PPO | Source: Ambulatory Visit | Attending: Family Medicine | Admitting: Family Medicine

## 2016-06-11 DIAGNOSIS — Z1231 Encounter for screening mammogram for malignant neoplasm of breast: Secondary | ICD-10-CM

## 2016-06-11 DIAGNOSIS — N390 Urinary tract infection, site not specified: Secondary | ICD-10-CM | POA: Diagnosis not present

## 2016-06-11 DIAGNOSIS — R0781 Pleurodynia: Secondary | ICD-10-CM | POA: Diagnosis not present

## 2016-06-11 DIAGNOSIS — R109 Unspecified abdominal pain: Secondary | ICD-10-CM | POA: Diagnosis not present

## 2016-06-13 ENCOUNTER — Encounter: Payer: Self-pay | Admitting: Emergency Medicine

## 2016-06-13 ENCOUNTER — Emergency Department
Admission: EM | Admit: 2016-06-13 | Discharge: 2016-06-13 | Disposition: A | Discharge status: Home / Self Care | Payer: PPO | Attending: Emergency Medicine | Admitting: Emergency Medicine

## 2016-06-13 ENCOUNTER — Emergency Department: Payer: PPO

## 2016-06-13 DIAGNOSIS — I10 Essential (primary) hypertension: Secondary | ICD-10-CM | POA: Diagnosis not present

## 2016-06-13 DIAGNOSIS — R109 Unspecified abdominal pain: Secondary | ICD-10-CM

## 2016-06-13 DIAGNOSIS — M546 Pain in thoracic spine: Secondary | ICD-10-CM | POA: Diagnosis not present

## 2016-06-13 DIAGNOSIS — Z7984 Long term (current) use of oral hypoglycemic drugs: Secondary | ICD-10-CM | POA: Diagnosis not present

## 2016-06-13 DIAGNOSIS — Z79899 Other long term (current) drug therapy: Secondary | ICD-10-CM | POA: Diagnosis not present

## 2016-06-13 DIAGNOSIS — R1011 Right upper quadrant pain: Secondary | ICD-10-CM | POA: Diagnosis not present

## 2016-06-13 DIAGNOSIS — R0789 Other chest pain: Secondary | ICD-10-CM | POA: Insufficient documentation

## 2016-06-13 DIAGNOSIS — Z791 Long term (current) use of non-steroidal anti-inflammatories (NSAID): Secondary | ICD-10-CM | POA: Diagnosis not present

## 2016-06-13 DIAGNOSIS — E119 Type 2 diabetes mellitus without complications: Secondary | ICD-10-CM | POA: Diagnosis not present

## 2016-06-13 DIAGNOSIS — R1031 Right lower quadrant pain: Secondary | ICD-10-CM | POA: Diagnosis not present

## 2016-06-13 HISTORY — DX: Type 2 diabetes mellitus without complications: E11.9

## 2016-06-13 LAB — CBC
HCT: 37.9 % (ref 35.0–47.0)
Hemoglobin: 13.2 g/dL (ref 12.0–16.0)
MCH: 28.8 pg (ref 26.0–34.0)
MCHC: 35 g/dL (ref 32.0–36.0)
MCV: 82.5 fL (ref 80.0–100.0)
PLATELETS: 131 10*3/uL — AB (ref 150–440)
RBC: 4.59 MIL/uL (ref 3.80–5.20)
RDW: 14.1 % (ref 11.5–14.5)
WBC: 5.8 10*3/uL (ref 3.6–11.0)

## 2016-06-13 LAB — COMPREHENSIVE METABOLIC PANEL
ALK PHOS: 70 U/L (ref 38–126)
ALT: 18 U/L (ref 14–54)
AST: 18 U/L (ref 15–41)
Albumin: 3.7 g/dL (ref 3.5–5.0)
Anion gap: 2 — ABNORMAL LOW (ref 5–15)
BILIRUBIN TOTAL: 0.3 mg/dL (ref 0.3–1.2)
BUN: 22 mg/dL — AB (ref 6–20)
CALCIUM: 9.4 mg/dL (ref 8.9–10.3)
CHLORIDE: 105 mmol/L (ref 101–111)
CO2: 33 mmol/L — ABNORMAL HIGH (ref 22–32)
CREATININE: 0.73 mg/dL (ref 0.44–1.00)
Glucose, Bld: 111 mg/dL — ABNORMAL HIGH (ref 65–99)
Potassium: 4.3 mmol/L (ref 3.5–5.1)
Sodium: 140 mmol/L (ref 135–145)
TOTAL PROTEIN: 6.5 g/dL (ref 6.5–8.1)

## 2016-06-13 LAB — URINALYSIS COMPLETE WITH MICROSCOPIC (ARMC ONLY)
Bacteria, UA: NONE SEEN
Bilirubin Urine: NEGATIVE
Glucose, UA: NEGATIVE mg/dL
Hgb urine dipstick: NEGATIVE
KETONES UR: NEGATIVE mg/dL
Nitrite: NEGATIVE
PROTEIN: NEGATIVE mg/dL
Specific Gravity, Urine: 1.017 (ref 1.005–1.030)
pH: 6 (ref 5.0–8.0)

## 2016-06-13 LAB — LIPASE, BLOOD: LIPASE: 28 U/L (ref 11–51)

## 2016-06-13 MED ORDER — MORPHINE SULFATE (PF) 4 MG/ML IV SOLN
4.0000 mg | Freq: Once | INTRAVENOUS | Status: DC
  Filled 2016-06-13: qty 1

## 2016-06-13 MED ORDER — IOPAMIDOL (ISOVUE-370) INJECTION 76%
75.0000 mL | Freq: Once | INTRAVENOUS | Status: AC | PRN
  Administered 2016-06-13: 75 mL via INTRAVENOUS

## 2016-06-13 MED ORDER — TRAMADOL HCL 50 MG PO TABS: 50.0000 mg | ORAL_TABLET | Freq: Four times a day (QID) | ORAL | 0 refills | Status: AC | PRN

## 2016-06-13 NOTE — ED Triage Notes (Signed)
C/O RUQ abdominal pain since Thursday. . Denies N/V/D.  Sent from [facility] for evaluation.

## 2016-06-13 NOTE — ED Notes (Addendum)
Judith Mitchell, sign interpreter from [facility] arrived with pt at triage. Requested live deaf interpreter for further eval.

## 2016-06-13 NOTE — ED Provider Notes (Signed)
[facility] Emergency Department Provider Note        Time seen: ----------------------------------------- 11:31 AM on 06/13/2016 -----------------------------------------    I have reviewed the triage vital signs and the nursing notes.   HISTORY  Chief Complaint Abdominal Pain    HPI Judith Mitchell is a 65 y.o. female who presents to the ER for abdominal pain since Thursday. She denies fevers, chills, chest pain, shortness of breath, vomiting or diarrhea. [facility].Patient is not sure if she is in a workup for this problem in the past, states it does hurt to breathe. She feels like she has a cramp along the entire right side of her body. Nothing makes her symptoms better.   Past Medical History:  Diagnosis Date  . Cellulitis   . Deaf   . High cholesterol   . Hypertension   . Obesity     There are no active problems to display for this patient.   Past Surgical History:  Procedure Laterality Date  . ABDOMINAL HYSTERECTOMY    . CHOLECYSTECTOMY    . ESOPHAGOGASTRODUODENOSCOPY (EGD) WITH PROPOFOL N/A 01/29/2016   Procedure: ESOPHAGOGASTRODUODENOSCOPY (EGD) WITH PROPOFOL;  Surgeon: Christena DeemMartin U Skulskie, MD;  [facility] ENDOSCOPY;  Service: Endoscopy;  Laterality: N/A;    Allergies Review of patient's allergies indicates no known allergies.  Social History Social History  Substance Use Topics  . Smoking status: Never Smoker  . Smokeless tobacco: Never Used  . Alcohol use No    Review of Systems Constitutional: Negative for fever. Cardiovascular: Negative for chest pain. Respiratory: Positive for pain with breathing Gastrointestinal: Positive for abdominal pain Genitourinary: Negative for dysuria. Musculoskeletal: Negative for back pain. Skin: Negative for rash. Neurological: Negative for headaches, focal weakness or numbness.  10-point ROS otherwise  negative.  ____________________________________________   PHYSICAL EXAM:  VITAL SIGNS: ED Triage Vitals  Enc Vitals Group     BP 06/13/16 1018 (!) 161/59     Pulse Rate 06/13/16 1018 66     Resp 06/13/16 1018 16     Temp 06/13/16 1018 97.8 F (36.6 C)     Temp Source 06/13/16 1018 Oral     SpO2 06/13/16 1018 100 %     Weight 06/13/16 1019 297 lb (134.7 kg)     Height 06/13/16 1019 5\' 5"  (1.651 m)     Head Circumference --      Peak Flow --      Pain Score 06/13/16 1020 8     Pain Loc --      Pain Edu? --      Excl. in [place]? --     Constitutional: Alert and oriented. Well appearing and in no distress. Eyes: Conjunctivae are normal. PERRL. Normal extraocular movements. ENT   Head: Normocephalic and atraumatic.   Nose: No congestion/rhinnorhea.   Mouth/Throat: Mucous membranes are moist.   Neck: No stridor. Cardiovascular: Normal rate, regular rhythm. No murmurs, rubs, or gallops. Respiratory: Normal respiratory effort without tachypnea nor retractions. Breath sounds are clear and equal bilaterally. No wheezes/rales/rhonchi. Gastrointestinal: Right upper quadrant tenderness Musculoskeletal: Nontender with normal range of motion in all extremities. No lower extremity tenderness nor edema. Right chest wall tenderness Neurologic:  Normal speech and language. No gross focal neurologic deficits are appreciated.  Skin:  Skin is warm, dry and intact. No rash noted. Psychiatric: Mood and affect are normal. Speech and behavior are normal.  ____________________________________________  EKG: Interpreted by me.Sinus rhythm with a rate  of 62 bpm, normal PR interval, normal QRS, normal QT interval. Low voltage.  ____________________________________________  ED COURSE:  Pertinent labs & imaging results that were available during my care of the patient were reviewed by me and considered in my medical decision making (see chart for details). Clinical Course  Patient presents  to ER with thoracoabdominal pain on the right side. We will check basic labs and likely imaging. She did have workup done in May with including ultrasound and HIDA scan and this was post cholecystectomy.  Procedures ____________________________________________   LABS (pertinent positives/negatives)  Labs Reviewed  COMPREHENSIVE METABOLIC PANEL - Abnormal; Notable for the following:       Result Value   CO2 33 (*)    Glucose, Bld 111 (*)    BUN 22 (*)    Anion gap 2 (*)    All other components within normal limits  CBC - Abnormal; Notable for the following:    Platelets 131 (*)    All other components within normal limits  URINALYSIS COMPLETEWITH MICROSCOPIC ([facility] ONLY) - Abnormal; Notable for the following:    Color, Urine YELLOW (*)    APPearance CLEAR (*)    Leukocytes, UA TRACE (*)    Squamous Epithelial / LPF 0-5 (*)    All other components within normal limits  LIPASE, BLOOD    RADIOLOGY Images were viewed by me  CT angiogram of the chest IMPRESSION: Changes consistent prior granulomatous disease.  No pulmonary emboli are identified.  Thyroid goiter slightly larger than that seen in 2006.  ____________________________________________  FINAL ASSESSMENT AND PLAN  Thoracic pain, flank pain  Plan: Patient with labs and imaging as dictated above. No clear etiology for the patient's symptoms are identified. We'll advise pain medicine and close outpatient follow-up with her doctor. Patient was unaware that her gallbladder had been taken out prior.   Earleen Newport, MD   Note: This dictation was prepared with Dragon dictation. Any transcriptional errors that result from this process are unintentional    Earleen Newport, MD 06/13/16 1501

## 2016-06-13 NOTE — ED Notes (Signed)
Pt son contacted by this Probation officer at request of pt. Pt son given update on plan of care. Pt son states he is at work and could not come until [place].

## 2016-06-27 DIAGNOSIS — M5442 Lumbago with sciatica, left side: Secondary | ICD-10-CM | POA: Diagnosis not present

## 2016-06-27 DIAGNOSIS — M5441 Lumbago with sciatica, right side: Secondary | ICD-10-CM | POA: Diagnosis not present

## 2016-06-27 DIAGNOSIS — I10 Essential (primary) hypertension: Secondary | ICD-10-CM | POA: Diagnosis not present

## 2016-06-27 DIAGNOSIS — Z6841 Body Mass Index (BMI) 40.0 and over, adult: Secondary | ICD-10-CM | POA: Diagnosis not present

## 2016-06-27 DIAGNOSIS — R600 Localized edema: Secondary | ICD-10-CM | POA: Diagnosis not present

## 2016-07-21 ENCOUNTER — Emergency Department: Payer: PPO

## 2016-07-21 ENCOUNTER — Encounter: Payer: Self-pay | Admitting: Emergency Medicine

## 2016-07-21 ENCOUNTER — Emergency Department
Admission: EM | Admit: 2016-07-21 | Discharge: 2016-07-21 | Disposition: A | Discharge status: Home / Self Care | Payer: PPO | Attending: Emergency Medicine | Admitting: Emergency Medicine

## 2016-07-21 DIAGNOSIS — R1011 Right upper quadrant pain: Secondary | ICD-10-CM

## 2016-07-21 DIAGNOSIS — Z7984 Long term (current) use of oral hypoglycemic drugs: Secondary | ICD-10-CM | POA: Insufficient documentation

## 2016-07-21 DIAGNOSIS — M79602 Pain in left arm: Secondary | ICD-10-CM | POA: Diagnosis not present

## 2016-07-21 DIAGNOSIS — I10 Essential (primary) hypertension: Secondary | ICD-10-CM | POA: Diagnosis not present

## 2016-07-21 DIAGNOSIS — M79605 Pain in left leg: Secondary | ICD-10-CM | POA: Insufficient documentation

## 2016-07-21 DIAGNOSIS — Z79899 Other long term (current) drug therapy: Secondary | ICD-10-CM | POA: Diagnosis not present

## 2016-07-21 DIAGNOSIS — N390 Urinary tract infection, site not specified: Secondary | ICD-10-CM | POA: Insufficient documentation

## 2016-07-21 DIAGNOSIS — Z791 Long term (current) use of non-steroidal anti-inflammatories (NSAID): Secondary | ICD-10-CM | POA: Diagnosis not present

## 2016-07-21 DIAGNOSIS — E119 Type 2 diabetes mellitus without complications: Secondary | ICD-10-CM | POA: Insufficient documentation

## 2016-07-21 DIAGNOSIS — R109 Unspecified abdominal pain: Secondary | ICD-10-CM | POA: Diagnosis not present

## 2016-07-21 LAB — COMPREHENSIVE METABOLIC PANEL
ALBUMIN: 3.7 g/dL (ref 3.5–5.0)
ALT: 21 U/L (ref 14–54)
AST: 22 U/L (ref 15–41)
Alkaline Phosphatase: 62 U/L (ref 38–126)
Anion gap: 6 (ref 5–15)
BUN: 21 mg/dL — AB (ref 6–20)
CHLORIDE: 108 mmol/L (ref 101–111)
CO2: 26 mmol/L (ref 22–32)
Calcium: 9.3 mg/dL (ref 8.9–10.3)
Creatinine, Ser: 0.73 mg/dL (ref 0.44–1.00)
GFR calc Af Amer: >60 mL/min (ref >60)
Glucose, Bld: 171 mg/dL — ABNORMAL HIGH (ref 65–99)
POTASSIUM: 3.7 mmol/L (ref 3.5–5.1)
SODIUM: 140 mmol/L (ref 135–145)
Total Bilirubin: <0.1 mg/dL — ABNORMAL LOW (ref 0.3–1.2)
Total Protein: 6.7 g/dL (ref 6.5–8.1)

## 2016-07-21 LAB — URINALYSIS COMPLETE WITH MICROSCOPIC (ARMC ONLY)
Bilirubin Urine: NEGATIVE
GLUCOSE, UA: NEGATIVE mg/dL
HGB URINE DIPSTICK: NEGATIVE
Ketones, ur: NEGATIVE mg/dL
Nitrite: NEGATIVE
PH: 5 (ref 5.0–8.0)
Protein, ur: NEGATIVE mg/dL
Specific Gravity, Urine: 1.024 (ref 1.005–1.030)

## 2016-07-21 LAB — CBC
HEMATOCRIT: 39.1 % (ref 35.0–47.0)
Hemoglobin: 13.8 g/dL (ref 12.0–16.0)
MCH: 28.8 pg (ref 26.0–34.0)
MCHC: 35.2 g/dL (ref 32.0–36.0)
MCV: 81.9 fL (ref 80.0–100.0)
Platelets: 139 10*3/uL — ABNORMAL LOW (ref 150–440)
RBC: 4.77 MIL/uL (ref 3.80–5.20)
RDW: 13.4 % (ref 11.5–14.5)
WBC: 4.6 10*3/uL (ref 3.6–11.0)

## 2016-07-21 LAB — LIPASE, BLOOD: LIPASE: 28 U/L (ref 11–51)

## 2016-07-21 MED ORDER — SULFAMETHOXAZOLE-TRIMETHOPRIM 800-160 MG PO TABS: 1.0000 | ORAL_TABLET | Freq: Two times a day (BID) | ORAL | 0 refills | Status: DC

## 2016-07-21 MED ORDER — IOPAMIDOL (ISOVUE-300) INJECTION 61%
100.0000 mL | Freq: Once | INTRAVENOUS | Status: AC | PRN
  Administered 2016-07-21: 100 mL via INTRAVENOUS
  Filled 2016-07-21: qty 100

## 2016-07-21 MED ORDER — TRAMADOL HCL 50 MG PO TABS: 50.0000 mg | ORAL_TABLET | Freq: Four times a day (QID) | ORAL | 0 refills | Status: AC | PRN

## 2016-07-21 MED ORDER — IOPAMIDOL (ISOVUE-300) INJECTION 61%
30.0000 mL | Freq: Once | INTRAVENOUS | Status: AC
  Administered 2016-07-21: 30 mL via ORAL
  Filled 2016-07-21: qty 30

## 2016-07-21 NOTE — ED Notes (Signed)
Assisted patient in CT with [facility] for ASL interpreter.  Informed patient that it will be at 45 minutes for results to be read.  Husband is in the room with patient at this time. Pt also ambulated without assistance to the bathroom.

## 2016-07-21 NOTE — ED Provider Notes (Signed)
[facility] Emergency Department Provider Note        Time seen: ----------------------------------------- 2:25 PM on 07/21/2016 -----------------------------------------    I have reviewed the triage vital signs and the nursing notes.   HISTORY  Chief Complaint Abdominal Pain    HPI Judith Mitchell is a 65 y.o. female who presents to ER with right upper quadrant pain.Patient states she's had this pain for weeks, nothing makes it better or worse. Patient states the pain was so severe today that she had trouble doing anything. She's had some diarrhea, but denies fever, chills, chest pain, shortness of breath or vomiting. Patient was seen by me approximately a month ago for similar and had a negative workup including labs and CT angiogram of the chest. Patient has also had pain in her left arm and left leg.   Past Medical History:  Diagnosis Date  . Cellulitis   . Deaf   . Diabetes mellitus without complication ([place])   . High cholesterol   . Hypertension   . Obesity     There are no active problems to display for this patient.   Past Surgical History:  Procedure Laterality Date  . ABDOMINAL HYSTERECTOMY    . CHOLECYSTECTOMY    . ESOPHAGOGASTRODUODENOSCOPY (EGD) WITH PROPOFOL N/A 01/29/2016   Procedure: ESOPHAGOGASTRODUODENOSCOPY (EGD) WITH PROPOFOL;  Surgeon: Lollie Sails, MD;  Location: [facility] ENDOSCOPY;  Service: Endoscopy;  Laterality: N/A;    Allergies Review of patient's allergies indicates no known allergies.  Social History Social History  Substance Use Topics  . Smoking status: Never Smoker  . Smokeless tobacco: Never Used  . Alcohol use No    Review of Systems Constitutional: Negative for fever. Cardiovascular: Negative for chest pain. Respiratory: Negative for shortness of breath. Gastrointestinal: Positive for abdominal pain, diarrhea Genitourinary: Negative for dysuria. Musculoskeletal: Positive for arm and leg  pain Skin: Negative for rash. Neurological: Negative for headaches, focal weakness or numbness.  10-point ROS otherwise negative.  ____________________________________________   PHYSICAL EXAM:  VITAL SIGNS: ED Triage Vitals  Enc Vitals Group     BP 07/21/16 1347 (!) 184/82     Pulse Rate 07/21/16 1347 72     Resp 07/21/16 1347 18     Temp 07/21/16 1347 97.8 F (36.6 C)     Temp Source 07/21/16 1347 Oral     SpO2 07/21/16 1347 97 %     Weight 07/21/16 1348 294 lb 8 oz (133.6 kg)     Height --      Head Circumference --      Peak Flow --      Pain Score --      Pain Loc --      Pain Edu? --      Excl. in [place]? --     Constitutional: Alert and oriented. Well appearing and in no distress. Eyes: Conjunctivae are normal. PERRL. Normal extraocular movements. ENT   Head: Normocephalic and atraumatic.   Nose: No congestion/rhinnorhea.   Mouth/Throat: Mucous membranes are moist.   Neck: No stridor. Cardiovascular: Normal rate, regular rhythm. No murmurs, rubs, or gallops. Respiratory: Normal respiratory effort without tachypnea nor retractions. Breath sounds are clear and equal bilaterally. No wheezes/rales/rhonchi. Gastrointestinal: Soft and nontender. Normal bowel sounds Musculoskeletal: Nontender with normal range of motion in all extremities. No lower extremity tenderness nor edema. Neurologic:  Normal speech and language. No gross focal neurologic deficits are appreciated.  Skin:  Skin is warm, dry and intact. No rash noted. Psychiatric:  Mood and affect are normal. Speech and behavior are normal.  ____________________________________________  ED COURSE:  Pertinent labs & imaging results that were available during my care of the patient were reviewed by me and considered in my medical decision making (see chart for details). Clinical Course  Patient is in no distress, we will assess with basic labs and likely CT of the abdomen and  pelvis.  Procedures ____________________________________________   LABS (pertinent positives/negatives)  Labs Reviewed  COMPREHENSIVE METABOLIC PANEL - Abnormal; Notable for the following:       Result Value   Glucose, Bld 171 (*)    BUN 21 (*)    Total Bilirubin <0.1 (*)    All other components within normal limits  CBC - Abnormal; Notable for the following:    Platelets 139 (*)    All other components within normal limits  URINALYSIS COMPLETEWITH MICROSCOPIC ([facility] ONLY) - Abnormal; Notable for the following:    Color, Urine YELLOW (*)    APPearance CLEAR (*)    Leukocytes, UA 2+ (*)    Bacteria, UA RARE (*)    Squamous Epithelial / LPF 0-5 (*)    All other components within normal limits  LIPASE, BLOOD    RADIOLOGY Images were viewed by me  CT of the abdomen and pelvis IMPRESSION: 1. No evidence of bowel obstruction or acute bowel inflammation. Normal appendix. No hydronephrosis. 2. Asymmetric skin thickening and mild associated subcutaneous fat stranding in the ventral right lower abdominal wall, a nonspecific finding that could indicate cellulitis or could represent a contusion. Clinical correlation is necessary. No superficial fluid collections. 3. Aortic atherosclerosis.  ____________________________________________  FINAL ASSESSMENT AND PLAN  Abdominal pain, Cystitis  Plan: Patient with labs and imaging as dictated above. No clear etiology for her symptoms, I will advise mild pain medication and continued outpatient follow-up with her doctor.   Earleen Newport, MD   Note: This dictation was prepared with Dragon dictation. Any transcriptional errors that result from this process are unintentional    Earleen Newport, MD 07/21/16 1736

## 2016-07-21 NOTE — ED Triage Notes (Signed)
Pt to ED with c/o of  RUQ abdominal pain that started about 3 wks ago. Using the interpreter Pt says the pain has started to radiate to her left arm and left leg and is throbbing. She denies n/v but had diarrhea this morning and last night.

## 2016-07-21 NOTE — ED Notes (Signed)
Used VRI for ASL to explain to the patient the process of drinking the contrast.

## 2016-07-21 NOTE — ED Notes (Signed)
Patient transported to CT 

## 2016-07-21 NOTE — ED Notes (Signed)
CT notified pt finished with contrast. 

## 2016-07-24 ENCOUNTER — Encounter: Payer: Self-pay | Admitting: Dietician

## 2016-07-24 ENCOUNTER — Encounter: Payer: PPO | Attending: Family Medicine | Admitting: Dietician

## 2016-07-24 DIAGNOSIS — E669 Obesity, unspecified: Secondary | ICD-10-CM | POA: Diagnosis not present

## 2016-07-24 DIAGNOSIS — E119 Type 2 diabetes mellitus without complications: Secondary | ICD-10-CM | POA: Insufficient documentation

## 2016-07-24 DIAGNOSIS — Z713 Dietary counseling and surveillance: Secondary | ICD-10-CM | POA: Insufficient documentation

## 2016-07-24 NOTE — Patient Instructions (Signed)
   Eat 3 meals every day; eat something every 4-5 hours during the day.   Keep working to limit sweets and ice cream -- eat small portions, and less often.

## 2016-07-24 NOTE — Progress Notes (Signed)
Medical Nutrition Therapy: Visit start time: [id number]  end time: 1200 Assessment:  Diagnosis: Type 2 Diabetes, obesity Past medical history: HTN, HLD, GERD, UTI  Psychosocial issues/ stress concerns: reports moderate stress, does eat more and poorer choices when stressed.  Preferred learning method:  . discussion . Visual . Hands-on   Current weight: 292.3lbs  Height: 5'5" Medications, supplements: reviewed list in chart with patient  Progress and evaluation: Patient reports she is eating on a somewhat erratic schedule, and does not feel she is making healthy choices. She has recently worked to reduce sweets and starches; husband is working on heart healthy eating due to recent heart issues. Ms. Petrovic would like to lose weight and improve her health overall.  Physical activity: none; has [employer]  Dietary Intake:  Usual eating pattern includes 2-3 meals and 1-2 snacks per day. Dining out frequency: 1-2 meals per week.  Breakfast: strawberry special k with a small packet splenda; or oatmeal; toast, sausage, eggs, low sugar jam; sometimes skips not often Snack: not usually; rarely ice cream.  Lunch: usually skips; occasionally gets break at work and will have sandwich with ham or bacon or roast beef and sometimes cheese mozarella or low sodium cheddar, tomato; occasionally bologna/bratwurst sandwich with chips Snack: fruit or graham crackers with peanut butter Supper: not always; currently working in evenings; chicken and vegetables cooked in nu wave or oven; limits starch Snack: none; occasionally soda. Does eat if she has skipped a meal during the day -- sandwich, occasionally pizza, crackers with ham and cheese. Ice cream Beverages: water, some with flavoring added; sometimes tea if out to eat; sometimes regualr soda, occaisonally diet.   Nutrition Care Education: Topics covered: diabetes, weight management Basic nutrition: basic food groups, appropriate nutrient balance,  appropriate meal and snack schedule, general nutrition guidelines    Weight control: benefits of weight control, portion control, importance of low fat and low sugar food choices, benefits of fiber and whole grains. Used food models and plate method to teach basic meal planning.  Advanced nutrition: food label reading for carbohydrate and sodium content Diabetes: appropriate meal and snack schedule, appropriate carb intake and balance, importance of adequate protein and including vegetables   Nutritional Diagnosis:  [place]-3.3 Overweight/obesity As related to excess calories, inactivity.  As evidenced by patient report.  Intervention: Instruction as noted above.   Set starting goals with patient input.    End of appt rushed, as patient realized she needed to go to work. Will follow up.   Education Materials given:  . General diet guidelines for Diabetes . Plate Planner . Sample meal pattern/ menus: [employer] . Goals/ instructions  Learner/ who was taught:  . Patient   Level of understanding: . Partial understanding; needs review/ practice  Demonstrated degree of understanding via:   Teach back Learning barriers: . Hearing impairment--sign language interpreter Jinny Blossom assisted with visit  Willingness to learn/ readiness for change: . Eager, change in progress  Monitoring and Evaluation:  Dietary intake, exercise, BG control, and body weight      follow up: 08/14/16

## 2016-08-05 DIAGNOSIS — E876 Hypokalemia: Secondary | ICD-10-CM | POA: Diagnosis not present

## 2016-08-05 DIAGNOSIS — M545 Low back pain: Secondary | ICD-10-CM | POA: Diagnosis not present

## 2016-08-05 DIAGNOSIS — M25561 Pain in right knee: Secondary | ICD-10-CM | POA: Diagnosis not present

## 2016-08-05 DIAGNOSIS — M25562 Pain in left knee: Secondary | ICD-10-CM | POA: Diagnosis not present

## 2016-08-05 DIAGNOSIS — Z87440 Personal history of urinary (tract) infections: Secondary | ICD-10-CM | POA: Diagnosis not present

## 2016-08-05 DIAGNOSIS — M179 Osteoarthritis of knee, unspecified: Secondary | ICD-10-CM | POA: Diagnosis not present

## 2016-08-05 DIAGNOSIS — R739 Hyperglycemia, unspecified: Secondary | ICD-10-CM | POA: Diagnosis not present

## 2016-08-14 ENCOUNTER — Ambulatory Visit: Payer: PPO | Admitting: Dietician

## 2016-08-26 DIAGNOSIS — M5416 Radiculopathy, lumbar region: Secondary | ICD-10-CM | POA: Diagnosis not present

## 2016-08-26 DIAGNOSIS — M5136 Other intervertebral disc degeneration, lumbar region: Secondary | ICD-10-CM | POA: Diagnosis not present

## 2016-08-29 ENCOUNTER — Encounter: Payer: PPO | Attending: Family Medicine | Admitting: Dietician

## 2016-08-29 DIAGNOSIS — E669 Obesity, unspecified: Secondary | ICD-10-CM | POA: Diagnosis not present

## 2016-08-29 DIAGNOSIS — E119 Type 2 diabetes mellitus without complications: Secondary | ICD-10-CM

## 2016-08-29 NOTE — Patient Instructions (Addendum)
   Eat something every 3-5 hours during the day. If you have more than 5 hours between meals, have a small snack in between. Avoid going longer than 12 hours overnight without anything to eat, so if you eat breakfast at 8am, have a snack about 8-10pm the night before.   Start some light exercise on weekends, start with maybe 15 minutes, and increase as you build up strength.   Keep good control of food portions. If you eat a snack, take out a small portion and put the rest away.   Include protein with all meals and snacks; if you eat a salad include some chicken, eggs or chopped [place]. If you have cereal or oatmeal for breakfast, include 2-4 Tbsp. Nuts or a boiled egg. Protein might help prevent low blood sugar and keep you full better longer.

## 2016-08-29 NOTE — Progress Notes (Signed)
Medical Nutrition Therapy: Visit start time: 0900  end time: 1000  Assessment:  Diagnosis: obesity, diabetes Medical history changes: no changes Psychosocial issues/ stress concerns: none  Current weight: 294.1lbs  Height: 5'5" Medications, supplement changes: reviewed list in chart with patient  Progress and evaluation: Patient reports weight loss of 6lbs since initial visit; although today's weight reflects 1.8lb gain since then (07/24/16). She has been making positive diet changes, including fewer snacks and has avoided late-night snacks. She has had difficulty increasing physical activity due to leg and back pain as well as busy schedule.  Physical activity: none; plans to add activity on weekends  Dietary Intake:  Usual eating pattern includes 3 meals and 1 snacks per day. Dining out frequency: not assessed today.  Breakfast: special K with strawberries and banana, or oatmeal, sometimes boiled egg, occ toast, egg Snack: none Lunch: usually sandwich with ham and swiss on rye with tomato and garlic seasoning, or bologna and cheese, or other; occasionally salad with veg only and dressing; sometimes smart pop popcorn;  Stopped potato chips, wants to try slim fast chips Snack: sometimes, not regularly (pay day tomorrow) Supper: 11/1 sm pizza; sometimes sirloin steak with vegetables, maybe pasta with herbs; chicken and veg. Once had boiled chicken livers;  Snack: husband tries to limit due to weight loss efforts, patient feels she needs a snack. Avoids late night eating, sometimes drinks Healthy Balance cranberry juice or hot chocolate occasionally or broth, or pepsi.  Beverages: water, diet juice, occasionally regular soda (states she was told regular soda was better for her than diet)  Nutrition Care Education: Topics covered: weight management, diabetes Basic nutrition: appropriate nutrient balance, appropriate meal and snack schedule Weight control:reviewed progress since previous visit;  discussed role of exercise in managing weight and strategies for incorporating light exercise into schedule -- adviesd starting with short duration and increasing as able to minimize pain.  Diabetes:  Reviewed appropriate carb intake and balance, discussed importance of protein sources will all meals and effect on BGs, if and when snacks might be needed as well as healthy snack options.     Nutritional Diagnosis:  [place]-3.3 Overweight/obesity As related to history of excess calories, inactivity.  As evidenced by patient report.  Intervention: Discussion as noted above.   Encouraged patient to continue working on healthy changes, advised avoiding or minimizing sugary drinks.    Updated goals with patient input.    She declined scheduling another follow-up, but will schedule later if needed.   Education Materials given:  Marland Kitchen Snacking handout . Goals/ instructions   Learner/ who was taught:  . Patient   Level of understanding: Marland Kitchen Verbalizes/ demonstrates competency  Demonstrated degree of understanding via:   Teach back Learning barriers: . Hearing impairment: sign language interpreter assisted with visit  Willingness to learn/ readiness for change: . Eager, change in progress  Monitoring and Evaluation:  Dietary intake, exercise, BG control, and body weight      follow up: prn

## 2016-09-26 DIAGNOSIS — M5416 Radiculopathy, lumbar region: Secondary | ICD-10-CM | POA: Diagnosis not present

## 2016-09-26 DIAGNOSIS — M5136 Other intervertebral disc degeneration, lumbar region: Secondary | ICD-10-CM | POA: Diagnosis not present

## 2017-03-16 IMAGING — NM NM HEPATOBILIARY IMAGE, INC GB
1 series · 6 of 6 positions shown · non-contrast
Comparison: CT scan of the abdomen and pelvis of December 19, 2015
which in reveals no gallbladder in the presence of surgical clips in
the gallbladder fossa.

CLINICAL DATA: [DATE] month history of right upper quadrant pain
with increased symptoms over the past week ; diarrhea but no nausea
or vomiting. Previous cholecystectomy.

EXAM:
NUCLEAR MEDICINE HEPATOBILIARY IMAGING
TECHNIQUE: Sequential images of the abdomen were obtained [DATE] minutes
following intravenous administration of radiopharmaceutical.
RADIOPHARMACEUTICALS:  5.38 mCi Pc-88m  Choletec IV

[Series 1000: hepatobiliary dynamic · 9.59mm/px · 6 of 60 frames shown]
[frame 6/60]
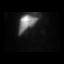
[frame 16/60]
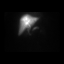
[frame 26/60]
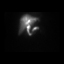
[frame 36/60]
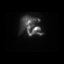
[frame 46/60]
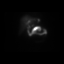
[frame 56/60]
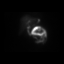

[6 of 6 positions shown; findings below may reference images not displayed]

FINDINGS: Prompt uptake and biliary excretion of activity by the liver is
seen. Biliary activity passes into small bowel, consistent with
patent common bile duct.
IMPRESSION: Patient has undergone previous cholecystectomy. Uptake in excretion
of the radiopharmaceutical by the liver into the common bile duct
and bowel is normal.

## 2017-04-02 IMAGING — US US ABDOMEN COMPLETE
1 series · 14 of 25 positions shown · non-contrast
Comparison: 12/19/2015

CLINICAL DATA: Abdominal pain.  Right upper quadrant pain.

EXAM:
ABDOMEN ULTRASOUND COMPLETE

[Series 1: us abdomen complete · 0.25mm/px · 14 of 69 slices shown]
[im 1/69]
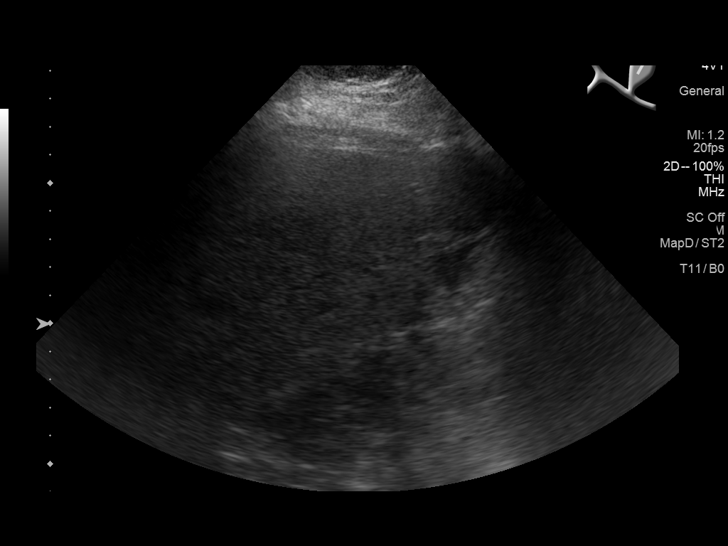
[im 6/69]
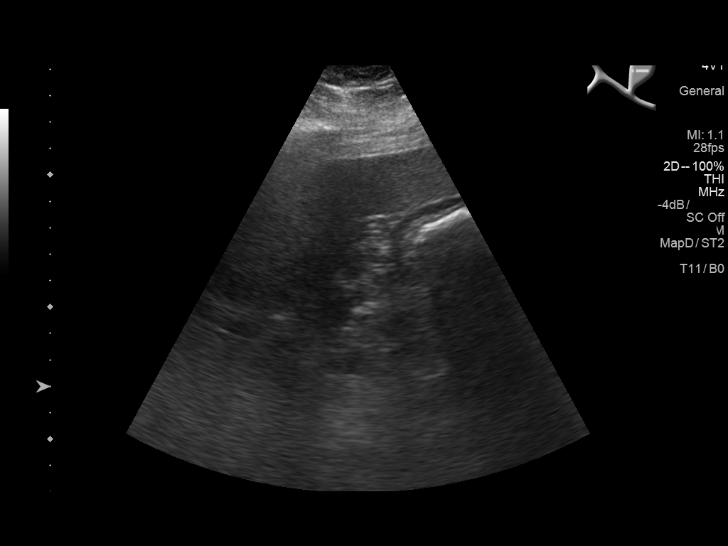
[im 12/69]
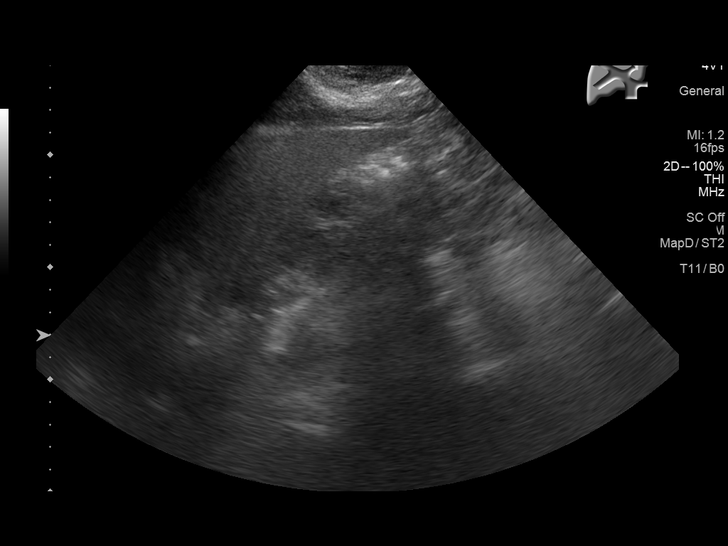
[im 18/69]
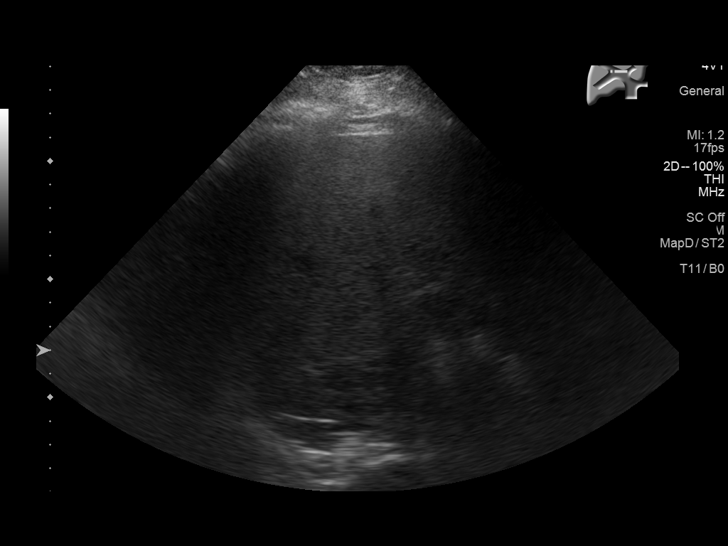
[im 23/69]
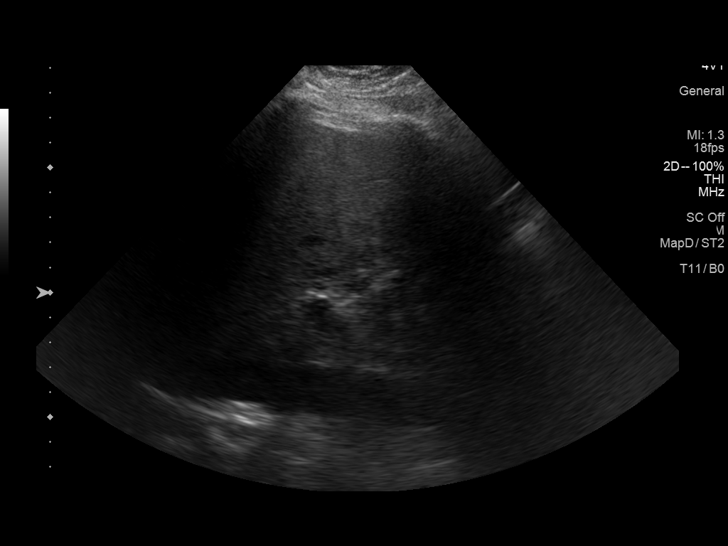
[im 26/69]
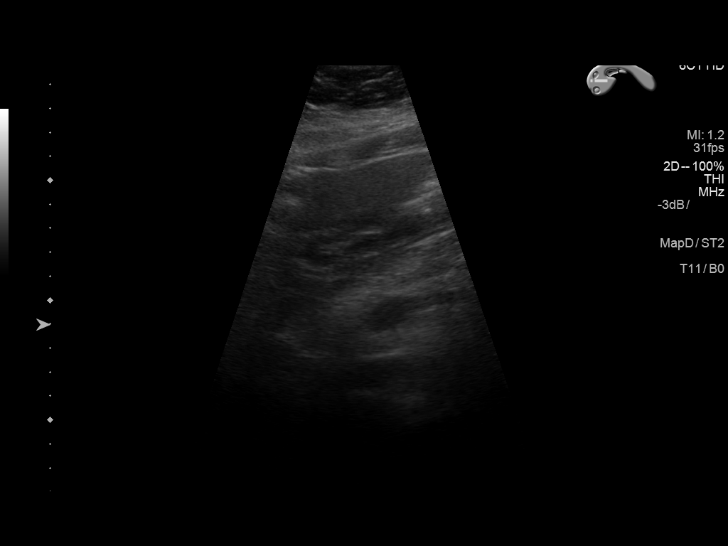
[im 32/69]
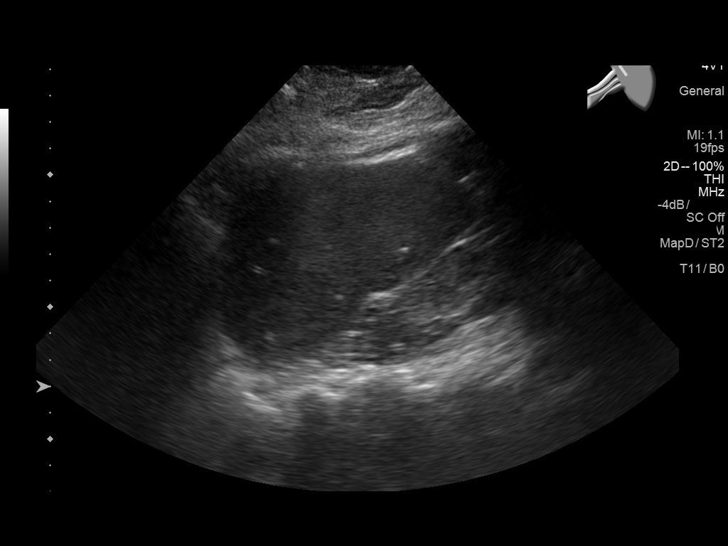
[im 37/69]
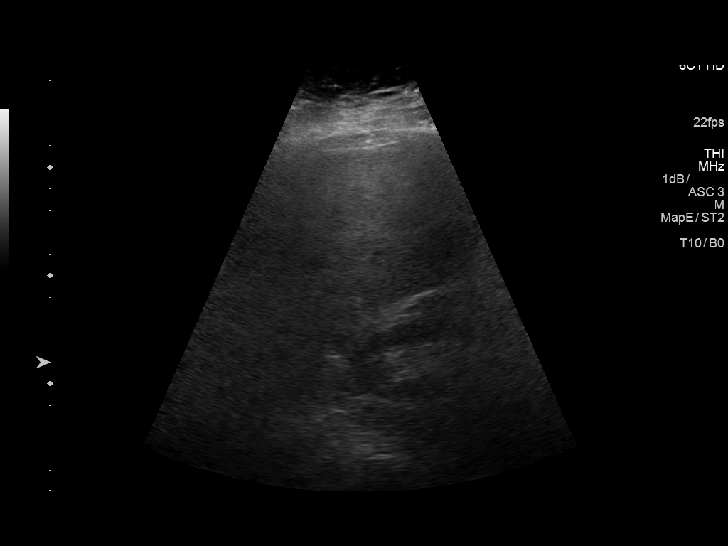
[im 43/69]
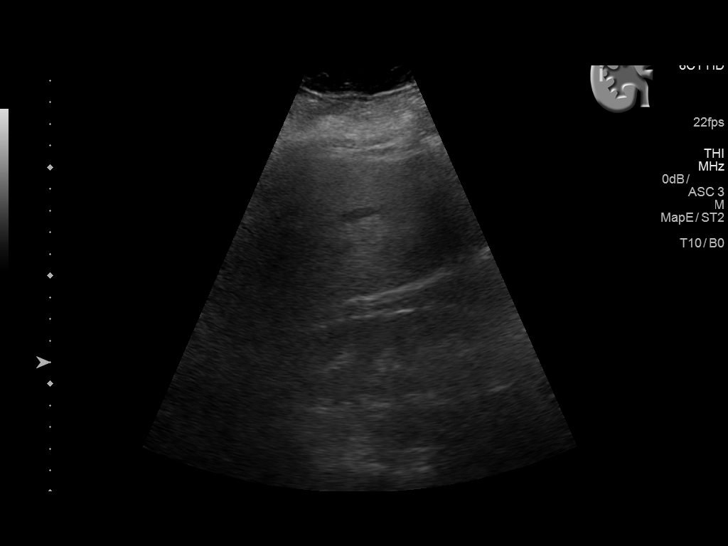
[im 46/69]
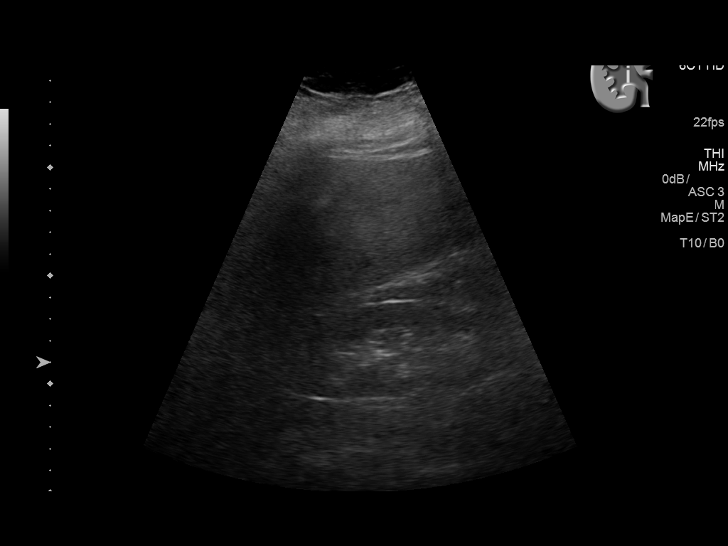
[im 52/69]
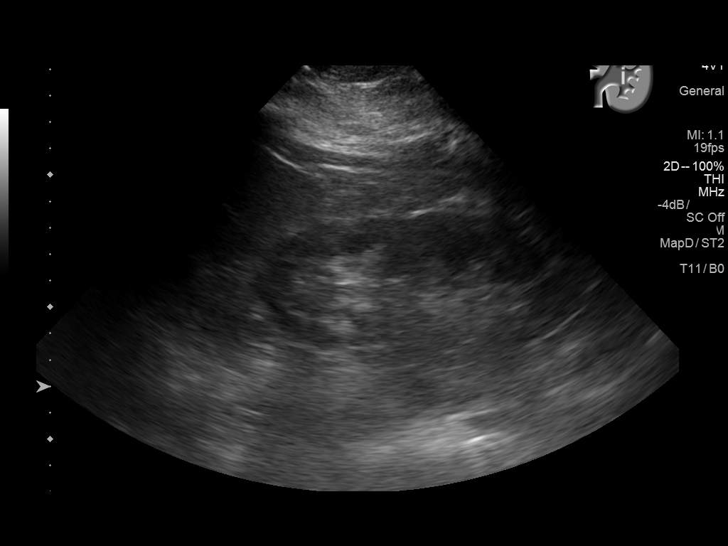
[im 57/69]
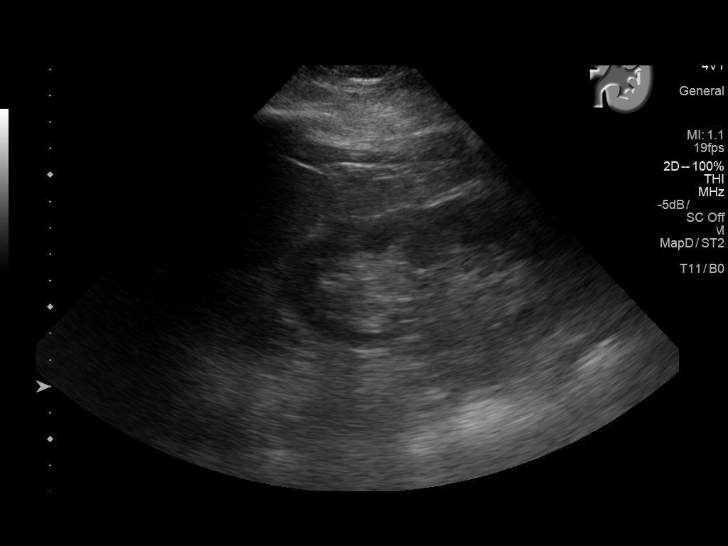
[im 63/69]
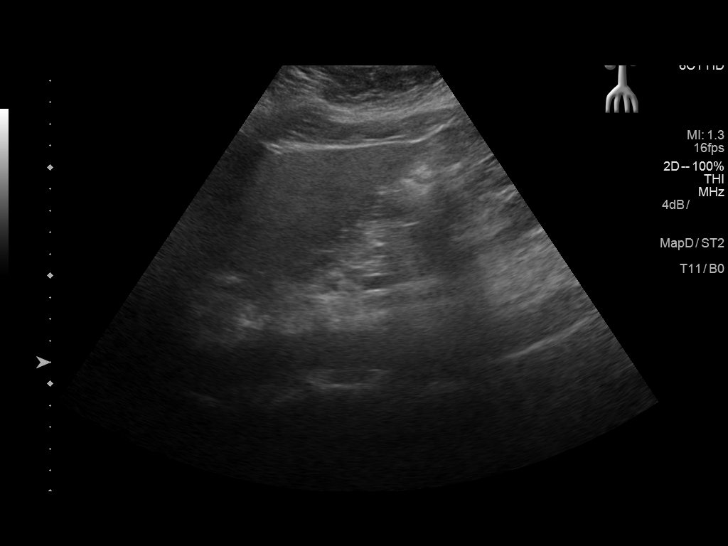
[im 69/69]
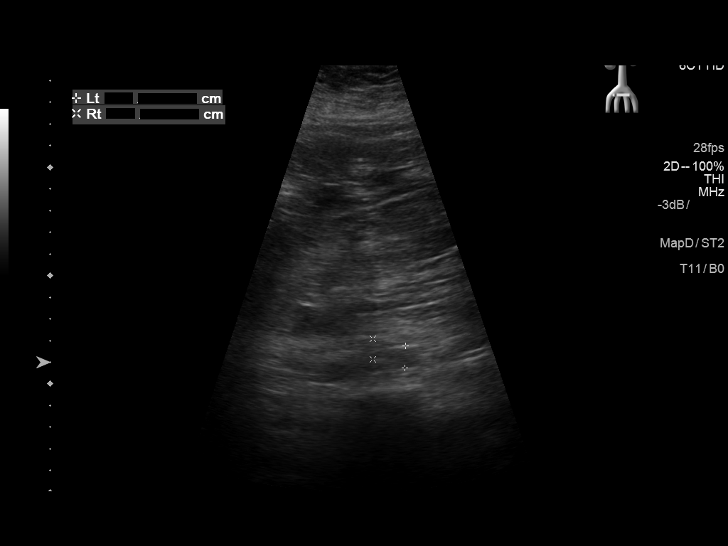

[14 of 25 positions shown; findings below may reference images not displayed]

FINDINGS: Gallbladder: Not visualized.

Common bile duct: Diameter: 3.4 mm

Liver: No focal lesion identified.  Diffuse increased echogenicity.

IVC: No abnormality visualized.

Pancreas: Visualized portion unremarkable.

Spleen: Measures 12.1 cm in length. Multiple calcified granulomas
identified.

Right Kidney: Length: 12.0 cm. Echogenicity within normal limits. No
mass or hydronephrosis visualized.

Left Kidney: Length: 12.6 cm. Echogenicity within normal limits. No
mass or hydronephrosis visualized.

Abdominal aorta: No aneurysm visualized.

Other findings: None.
IMPRESSION: 1. Echogenic liver compatible with hepatic steatosis.
2. Splenic granulomas noted.

## 2017-05-27 DIAGNOSIS — J069 Acute upper respiratory infection, unspecified: Secondary | ICD-10-CM | POA: Diagnosis not present

## 2018-05-12 DIAGNOSIS — M25532 Pain in left wrist: Secondary | ICD-10-CM | POA: Diagnosis not present

## 2018-05-12 DIAGNOSIS — M19032 Primary osteoarthritis, left wrist: Secondary | ICD-10-CM | POA: Diagnosis not present

## 2018-06-21 ENCOUNTER — Emergency Department: Payer: PPO

## 2018-06-21 ENCOUNTER — Other Ambulatory Visit: Payer: Self-pay

## 2018-06-21 ENCOUNTER — Emergency Department
Admission: EM | Admit: 2018-06-21 | Discharge: 2018-06-21 | Disposition: A | Discharge status: Home / Self Care | Payer: PPO | Attending: Emergency Medicine | Admitting: Emergency Medicine

## 2018-06-21 ENCOUNTER — Encounter: Payer: Self-pay | Admitting: Emergency Medicine

## 2018-06-21 DIAGNOSIS — Y9389 Activity, other specified: Secondary | ICD-10-CM | POA: Diagnosis not present

## 2018-06-21 DIAGNOSIS — W01198A Fall on same level from slipping, tripping and stumbling with subsequent striking against other object, initial encounter: Secondary | ICD-10-CM | POA: Diagnosis not present

## 2018-06-21 DIAGNOSIS — W19XXXA Unspecified fall, initial encounter: Secondary | ICD-10-CM | POA: Diagnosis not present

## 2018-06-21 DIAGNOSIS — Y998 Other external cause status: Secondary | ICD-10-CM | POA: Diagnosis not present

## 2018-06-21 DIAGNOSIS — E119 Type 2 diabetes mellitus without complications: Secondary | ICD-10-CM | POA: Insufficient documentation

## 2018-06-21 DIAGNOSIS — S80212A Abrasion, left knee, initial encounter: Secondary | ICD-10-CM | POA: Diagnosis not present

## 2018-06-21 DIAGNOSIS — S199XXA Unspecified injury of neck, initial encounter: Secondary | ICD-10-CM | POA: Diagnosis not present

## 2018-06-21 DIAGNOSIS — S0083XA Contusion of other part of head, initial encounter: Secondary | ICD-10-CM

## 2018-06-21 DIAGNOSIS — I10 Essential (primary) hypertension: Secondary | ICD-10-CM | POA: Insufficient documentation

## 2018-06-21 DIAGNOSIS — T148XXA Other injury of unspecified body region, initial encounter: Secondary | ICD-10-CM

## 2018-06-21 DIAGNOSIS — Y9289 Other specified places as the place of occurrence of the external cause: Secondary | ICD-10-CM | POA: Insufficient documentation

## 2018-06-21 DIAGNOSIS — S8992XA Unspecified injury of left lower leg, initial encounter: Secondary | ICD-10-CM | POA: Diagnosis not present

## 2018-06-21 DIAGNOSIS — S0990XA Unspecified injury of head, initial encounter: Secondary | ICD-10-CM | POA: Diagnosis not present

## 2018-06-21 DIAGNOSIS — Z79899 Other long term (current) drug therapy: Secondary | ICD-10-CM | POA: Insufficient documentation

## 2018-06-21 DIAGNOSIS — S8991XA Unspecified injury of right lower leg, initial encounter: Secondary | ICD-10-CM | POA: Diagnosis not present

## 2018-06-21 DIAGNOSIS — S0993XA Unspecified injury of face, initial encounter: Secondary | ICD-10-CM | POA: Diagnosis not present

## 2018-06-21 NOTE — ED Notes (Signed)
Patient transported to CT and XR 

## 2018-06-21 NOTE — ED Provider Notes (Signed)
[facility] Emergency Department Provider Note  ____________________________________________   I have reviewed the triage vital signs and the nursing notes. Where available I have reviewed prior notes and, if possible and indicated, outside hospital notes.    HISTORY  Chief Complaint Fall    HPI Judith Mitchell is a 67 y.o. female who presents today complaining of a non-syncopal fall.  She tripped on rocks and fell bumping her head.  Did not pass out.  Had some pain to the left lateral neck, c-collar was placed by EMS.  Also has an abrasion to her left knee.  Is able to stand up afterwards.  History is limited by patient's hearing impaired.  We did use a translator for this encounter.  No loss of conscious no vomiting no severe headache, not on blood thinners no other complaints.  Denies is focal numbness or weakness  Past Medical History:  Diagnosis Date  . Cellulitis   . Deaf   . Diabetes mellitus without complication ([place])   . High cholesterol   . Hypertension   . Obesity     There are no active problems to display for this patient.   Past Surgical History:  Procedure Laterality Date  . ABDOMINAL HYSTERECTOMY    . CHOLECYSTECTOMY    . ESOPHAGOGASTRODUODENOSCOPY (EGD) WITH PROPOFOL N/A 01/29/2016   Procedure: ESOPHAGOGASTRODUODENOSCOPY (EGD) WITH PROPOFOL;  Surgeon: Lollie Sails, MD;  Location: [facility] ENDOSCOPY;  Service: Endoscopy;  Laterality: N/A;    Prior to Admission medications   Medication Sig Start Date End Date Taking? Authorizing Provider  famotidine (PEPCID) 20 MG tablet Take by mouth.    [provider]  HYDROcodone-acetaminophen (NORCO/VICODIN) 5-325 MG tablet 1/2-1 po bid prn 08/26/16   [provider]  loperamide (IMODIUM A-D) 2 MG tablet Take 1 tablet (2 mg total) by mouth 4 (four) times daily as needed for diarrhea or loose stools. 01/30/16   Eula Listen, MD  metFORMIN (GLUCOPHAGE-XR) 500 MG 24 hr  tablet Take 500 mg by mouth daily with supper.    [provider]  naproxen (NAPROSYN) 500 MG tablet Take 500 mg by mouth 2 (two) times daily as needed for mild pain. Reported on 01/29/2016    [provider]  simvastatin (ZOCOR) 40 MG tablet Take 40 mg by mouth every evening.     [provider]  sulfamethoxazole-trimethoprim (BACTRIM DS) 800-160 MG tablet Take 1 tablet by mouth 2 (two) times daily. 07/21/16   Earleen Newport, MD  triamterene-hydrochlorothiazide (MAXZIDE-25) 37.5-25 MG tablet Take 1 tablet by mouth every evening.     [provider]    Allergies Patient has no known allergies.  History reviewed. No pertinent family history.  Social History Social History   Tobacco Use  . Smoking status: Never Smoker  . Smokeless tobacco: Never Used  Substance Use Topics  . Alcohol use: No  . Drug use: No    Review of Systems Constitutional: No fever/chills Eyes: No visual changes. ENT: No sore throat. No stiff neck no neck pain Cardiovascular: Denies chest pain. Respiratory: Denies shortness of breath. Gastrointestinal:   no vomiting.  No diarrhea.  No constipation. Genitourinary: Negative for dysuria. Musculoskeletal: Negative lower extremity swelling Skin: Negative for rash. Neurological: Negative for severe headaches, focal weakness or numbness.   ____________________________________________   PHYSICAL EXAM:  VITAL SIGNS: ED Triage Vitals  Enc Vitals Group     BP 06/21/18 1247 (!) 192/103     Pulse Rate 06/21/18 1247 67  Resp 06/21/18 1247 (!) 22     Temp 06/21/18 1247 97.7 F (36.5 C)     Temp Source 06/21/18 1247 Oral     SpO2 06/21/18 1247 97 %     Weight 06/21/18 1248 297 lb (134.7 kg)     Height 06/21/18 1248 5\' 6"  (1.676 m)     Head Circumference --      Peak Flow --      Pain Score 06/21/18 1247 5     Pain Loc --      Pain Edu? --      Excl. in [place]? --     Constitutional: Alert and oriented. Well appearing  and in no acute distress. Eyes: Conjunctivae are normal Head: Atraumatic slight abrasion to the bridge of the nose HEENT: No congestion/rhinnorhea. Mucous membranes are moist.  Oropharynx non-erythematous Neck:   Normal trapezius muscle tenderness principally in the cervical region does seem to cross the midline but mostly its trapezius pain with no meningismus, no masses, no stridor Cardiovascular: Normal rate, regular rhythm. Grossly normal heart sounds.  Good peripheral circulation. Respiratory: Normal respiratory effort.  No retractions. Lungs CTAB. Abdominal: Soft and nontender. No distention. No guarding no rebound Back:  There is no focal tenderness or step off.  there is no midline tenderness there are no lesions noted. there is no CVA tenderness Musculoskeletal: No lower extremity tenderness, no upper extremity tenderness. No joint effusions, no DVT signs strong distal pulses no edema Neurologic:  Normal speech and language. No gross focal neurologic deficits are appreciated.  Skin:  Skin is warm, dry and intact.  Abrasion noted to the ridge of the nose as well as to the left knee Psychiatric: Mood and affect are normal. Speech and behavior are normal.  ____________________________________________   LABS (all labs ordered are listed, but only abnormal results are displayed)  Labs Reviewed - No data to display  Pertinent labs  results that were available during my care of the patient were reviewed by me and considered in my medical decision making (see chart for details). ____________________________________________  EKG  I personally interpreted any EKGs ordered by me or triage  ____________________________________________  RADIOLOGY  Pertinent labs & imaging results that were available during my care of the patient were reviewed by me and considered in my medical decision making (see chart for details). If possible, patient and/or family made aware of any abnormal  findings.  No results found. ____________________________________________    PROCEDURES  Procedure(s) performed: None  Procedures  Critical Care performed: None  ____________________________________________   INITIAL IMPRESSION / ASSESSMENT AND PLAN / ED COURSE  Pertinent labs & imaging results that were available during my care of the patient were reviewed by me and considered in my medical decision making (see chart for details). Patient here after a non-syncopal fall, we will x-ray her knees although very low suspicion for fracture I can fully range both over the ligamentous laxity noted, family is very concerned about this however we will x-ray, we will get obtain imaging for CT head because of her closed head injury, CT cervical spine and CT maxillofacial if those are negative is my hope we can get her home.  Very low suspicion for intracranial bleed very low suspicion for ligamentous injury to the C-spine.    ____________________________________________   FINAL CLINICAL IMPRESSION(S) / ED DIAGNOSES  Final diagnoses:  None      This chart was dictated using voice recognition software.  Despite best efforts to proofread,  errors can occur which can change meaning.      Schuyler Amor, MD 06/21/18 1339

## 2018-06-21 NOTE — ED Notes (Signed)
Pt resting quietly in room, waiting for results to be explained by MD.  Respirations equal and unlabored.

## 2018-06-21 NOTE — ED Triage Notes (Addendum)
[place] EMS from church with reports of tripping on rocks and falling into the [employer] of a truck. Pt hit the front of her forehead. Pt also fell onto left knee and left elbow and has abrasions to both.  Pt was wearing glasses and the bridge of her nose is scratched by her glasses.  Denies LOC, pt arrived in C-collar from EMS.   Pt is deaf and requires an interpreter.  VRI equipment used for interpretation.

## 2018-06-21 NOTE — ED Notes (Signed)
C-collar removed by MD

## 2018-06-21 NOTE — ED Notes (Signed)
Returned from CT and XR.

## 2018-06-21 NOTE — ED Notes (Signed)
Pt ambulatory to bathroom, standby assistance.  

## 2018-06-23 DIAGNOSIS — R42 Dizziness and giddiness: Secondary | ICD-10-CM | POA: Diagnosis not present

## 2018-06-23 DIAGNOSIS — Z9181 History of falling: Secondary | ICD-10-CM | POA: Diagnosis not present

## 2018-06-23 DIAGNOSIS — I10 Essential (primary) hypertension: Secondary | ICD-10-CM | POA: Diagnosis not present

## 2018-06-23 DIAGNOSIS — S0083XA Contusion of other part of head, initial encounter: Secondary | ICD-10-CM | POA: Diagnosis not present

## 2018-08-19 DIAGNOSIS — D3131 Benign neoplasm of right choroid: Secondary | ICD-10-CM | POA: Diagnosis not present

## 2018-12-16 ENCOUNTER — Encounter: Payer: Self-pay | Admitting: Emergency Medicine

## 2018-12-16 ENCOUNTER — Other Ambulatory Visit: Payer: Self-pay

## 2018-12-16 ENCOUNTER — Emergency Department: Payer: PPO

## 2018-12-16 ENCOUNTER — Emergency Department
Admission: EM | Admit: 2018-12-16 | Discharge: 2018-12-16 | Discharge status: Against Medical Advice | Payer: PPO | Attending: Emergency Medicine | Admitting: Emergency Medicine

## 2018-12-16 DIAGNOSIS — R079 Chest pain, unspecified: Secondary | ICD-10-CM | POA: Insufficient documentation

## 2018-12-16 DIAGNOSIS — Z5321 Procedure and treatment not carried out due to patient leaving prior to being seen by health care provider: Secondary | ICD-10-CM | POA: Diagnosis not present

## 2018-12-16 LAB — BASIC METABOLIC PANEL
Anion gap: 9 (ref 5–15)
BUN: 16 mg/dL (ref 8–23)
CO2: 24 mmol/L (ref 22–32)
Calcium: 9.3 mg/dL (ref 8.9–10.3)
Chloride: 106 mmol/L (ref 98–111)
Creatinine, Ser: 0.72 mg/dL (ref 0.44–1.00)
GFR calc Af Amer: >60 mL/min (ref >60)
GFR calc non Af Amer: >60 mL/min (ref >60)
Glucose, Bld: 131 mg/dL — ABNORMAL HIGH (ref 70–99)
Potassium: 3.8 mmol/L (ref 3.5–5.1)
Sodium: 139 mmol/L (ref 135–145)

## 2018-12-16 LAB — TROPONIN I: Troponin I: <0.03 ng/mL (ref <0.03)

## 2018-12-16 LAB — CBC
HCT: 42.5 % (ref 36.0–46.0)
Hemoglobin: 14.3 g/dL (ref 12.0–15.0)
MCH: 27.9 pg (ref 26.0–34.0)
MCHC: 33.6 g/dL (ref 30.0–36.0)
MCV: 82.8 fL (ref 80.0–100.0)
Platelets: 166 10*3/uL (ref 150–400)
RBC: 5.13 MIL/uL — ABNORMAL HIGH (ref 3.87–5.11)
RDW: 13.2 % (ref 11.5–15.5)
WBC: 5.1 10*3/uL (ref 4.0–10.5)
nRBC: 0 % (ref 0.0–0.2)

## 2018-12-16 NOTE — ED Notes (Signed)
First RN rounded on patient in the lobby. Delay explained to patient. Pt states understanding.

## 2018-12-16 NOTE — ED Triage Notes (Signed)
Pt sent over from [facility], presents with left side chest and shoulder pain since last night, reports associated dizziness and headache.  Denies any shortness of breath.  NAD noted at this time.

## 2018-12-16 NOTE — ED Notes (Addendum)
Pt states she cannot wait any longer and needs to leave. Pt encouraged to stay, explained she is next bed by wait time and MD has ordered repeat troponin level. Pt states she needs to leave and will follow up with PCP. Pt left before signing [place] form. Seen leaving lobby with steady gait.

## 2019-02-11 DIAGNOSIS — R05 Cough: Secondary | ICD-10-CM | POA: Diagnosis not present

## 2019-02-11 DIAGNOSIS — I10 Essential (primary) hypertension: Secondary | ICD-10-CM | POA: Diagnosis not present

## 2019-02-11 DIAGNOSIS — E119 Type 2 diabetes mellitus without complications: Secondary | ICD-10-CM | POA: Diagnosis not present

## 2019-02-11 DIAGNOSIS — R51 Headache: Secondary | ICD-10-CM | POA: Diagnosis not present

## 2019-02-12 DIAGNOSIS — E119 Type 2 diabetes mellitus without complications: Secondary | ICD-10-CM | POA: Diagnosis not present

## 2019-02-12 DIAGNOSIS — E785 Hyperlipidemia, unspecified: Secondary | ICD-10-CM | POA: Diagnosis not present

## 2019-02-12 DIAGNOSIS — I10 Essential (primary) hypertension: Secondary | ICD-10-CM | POA: Diagnosis not present

## 2019-02-12 DIAGNOSIS — R300 Dysuria: Secondary | ICD-10-CM | POA: Diagnosis not present

## 2019-05-20 DIAGNOSIS — H2513 Age-related nuclear cataract, bilateral: Secondary | ICD-10-CM | POA: Diagnosis not present

## 2019-05-28 DIAGNOSIS — H2511 Age-related nuclear cataract, right eye: Secondary | ICD-10-CM | POA: Diagnosis not present

## 2019-05-28 DIAGNOSIS — I10 Essential (primary) hypertension: Secondary | ICD-10-CM | POA: Diagnosis not present

## 2019-06-08 ENCOUNTER — Ambulatory Visit: Admit: 2019-06-08 | Payer: PPO | Admitting: Ophthalmology

## 2019-06-08 SURGERY — PHACOEMULSIFICATION, CATARACT, WITH IOL INSERTION
Anesthesia: Topical | Laterality: Right

## 2019-08-06 ENCOUNTER — Emergency Department
Admission: EM | Admit: 2019-08-06 | Discharge: 2019-08-06 | Disposition: A | Discharge status: Home / Self Care | Payer: PPO | Attending: Emergency Medicine | Admitting: Emergency Medicine

## 2019-08-06 ENCOUNTER — Encounter: Payer: Self-pay | Admitting: Emergency Medicine

## 2019-08-06 ENCOUNTER — Other Ambulatory Visit: Payer: Self-pay

## 2019-08-06 DIAGNOSIS — E119 Type 2 diabetes mellitus without complications: Secondary | ICD-10-CM | POA: Diagnosis not present

## 2019-08-06 DIAGNOSIS — Z7984 Long term (current) use of oral hypoglycemic drugs: Secondary | ICD-10-CM | POA: Diagnosis not present

## 2019-08-06 DIAGNOSIS — I10 Essential (primary) hypertension: Secondary | ICD-10-CM | POA: Insufficient documentation

## 2019-08-06 DIAGNOSIS — Z79899 Other long term (current) drug therapy: Secondary | ICD-10-CM | POA: Insufficient documentation

## 2019-08-06 DIAGNOSIS — L0100 Impetigo, unspecified: Secondary | ICD-10-CM | POA: Insufficient documentation

## 2019-08-06 DIAGNOSIS — L089 Local infection of the skin and subcutaneous tissue, unspecified: Secondary | ICD-10-CM | POA: Diagnosis present

## 2019-08-06 LAB — CBC WITH DIFFERENTIAL/PLATELET
Abs Immature Granulocytes: 0.02 10*3/uL (ref 0.00–0.07)
Basophils Absolute: 0 10*3/uL (ref 0.0–0.1)
Basophils Relative: 1 %
Eosinophils Absolute: 0.2 10*3/uL (ref 0.0–0.5)
Eosinophils Relative: 3 %
HCT: 42.3 % (ref 36.0–46.0)
Hemoglobin: 14 g/dL (ref 12.0–15.0)
Immature Granulocytes: 0 %
Lymphocytes Relative: 23 %
Lymphs Abs: 1.3 10*3/uL (ref 0.7–4.0)
MCH: 27.7 pg (ref 26.0–34.0)
MCHC: 33.1 g/dL (ref 30.0–36.0)
MCV: 83.6 fL (ref 80.0–100.0)
Monocytes Absolute: 0.4 10*3/uL (ref 0.1–1.0)
Monocytes Relative: 8 %
Neutro Abs: 3.7 10*3/uL (ref 1.7–7.7)
Neutrophils Relative %: 65 %
Platelets: 141 10*3/uL — ABNORMAL LOW (ref 150–400)
RBC: 5.06 MIL/uL (ref 3.87–5.11)
RDW: 13.2 % (ref 11.5–15.5)
WBC: 5.7 10*3/uL (ref 4.0–10.5)
nRBC: 0 % (ref 0.0–0.2)

## 2019-08-06 LAB — COMPREHENSIVE METABOLIC PANEL
ALT: 24 U/L (ref 0–44)
AST: 23 U/L (ref 15–41)
Albumin: 3.6 g/dL (ref 3.5–5.0)
Alkaline Phosphatase: 71 U/L (ref 38–126)
Anion gap: 10 (ref 5–15)
BUN: 16 mg/dL (ref 8–23)
CO2: 28 mmol/L (ref 22–32)
Calcium: 9.3 mg/dL (ref 8.9–10.3)
Chloride: 104 mmol/L (ref 98–111)
Creatinine, Ser: 0.8 mg/dL (ref 0.44–1.00)
GFR calc Af Amer: >60 mL/min (ref >60)
GFR calc non Af Amer: >60 mL/min (ref >60)
Glucose, Bld: 161 mg/dL — ABNORMAL HIGH (ref 70–99)
Potassium: 3.5 mmol/L (ref 3.5–5.1)
Sodium: 142 mmol/L (ref 135–145)
Total Bilirubin: 0.9 mg/dL (ref 0.3–1.2)
Total Protein: 6.4 g/dL — ABNORMAL LOW (ref 6.5–8.1)

## 2019-08-06 MED ORDER — CLINDAMYCIN HCL 300 MG PO CAPS: 300.0000 mg | ORAL_CAPSULE | Freq: Three times a day (TID) | ORAL | 0 refills | Status: AC

## 2019-08-06 NOTE — ED Notes (Signed)
MD at bedside with nurse and interpreter. Pts family at bedside as well.

## 2019-08-06 NOTE — Discharge Instructions (Addendum)
I think that this rash is an impetigo or skin infection by staph or strep.  Use the clindamycin 1 pill 3 times a day.  This is the antibiotic that should take care of any infection going on there.  Get some over-the-counter Benadryl cream and put it on the itchy spots.  Do not put it on broken skin.  You can wash the areas gently with antibacterial soap like safeguard or Dial.  Return here for increasing redness or itching or any other problems.  Or you can follow-up with your doctor.  You should start to get better in a couple days.  Dr. Kary Kos can see you on Monday at 230.  He will recheck your blood pressure and determine if he needs to restart you on the losartan-HCTZ at that time.  He did not have you listed as taking that medicine right now.  Your blood pressure is higher today and will need some follow-up to make sure that, if it does not come down, you can be started on a blood pressure medication.  Dr. Kary Kos can check on the rash as well.

## 2019-08-06 NOTE — ED Triage Notes (Signed)
Pt arrives with concerns over possible cellulitis to her right lower leg and a place on her upper neck at hairline. Red/blistered area to pt's right lower leg.

## 2019-08-06 NOTE — ED Provider Notes (Signed)
[facility] Emergency Department Provider Note   ____________________________________________   First MD Initiated Contact with Patient 08/06/19 1207     (approximate)  I have reviewed the triage vital signs and the nursing notes.   HISTORY  Chief Complaint Recurrent Skin Infections       HPI Judith Mitchell is a 68 y.o. female who complains of an itchy rash with blisters on the back of her neck and also on the anterior part of both of her legs just above the socks.  Is just started recently.  She is not had it before.  She reports she is not been out in the grass or gotten any poison ivy or anything else that should be making her having this problem.  Her blood sugar today is a little bit higher at 161.  She is not running a fever she does not have any other symptoms.         Past Medical History:  Diagnosis Date  . Cellulitis   . Deaf   . Diabetes mellitus without complication ([place])   . High cholesterol   . Hypertension   . Obesity     There are no active problems to display for this patient.   Past Surgical History:  Procedure Laterality Date  . ABDOMINAL HYSTERECTOMY    . CHOLECYSTECTOMY    . ESOPHAGOGASTRODUODENOSCOPY (EGD) WITH PROPOFOL N/A 01/29/2016   Procedure: ESOPHAGOGASTRODUODENOSCOPY (EGD) WITH PROPOFOL;  Surgeon: Lollie Sails, MD;  Location: [facility] ENDOSCOPY;  Service: Endoscopy;  Laterality: N/A;    Prior to Admission medications   Medication Sig Start Date End Date Taking? Authorizing Provider  clindamycin (CLEOCIN) 300 MG capsule Take 1 capsule (300 mg total) by mouth 3 (three) times daily for 10 days. 08/06/19 08/16/19  Nena Polio, MD  famotidine (PEPCID) 20 MG tablet Take by mouth.    [provider]  HYDROcodone-acetaminophen (NORCO/VICODIN) 5-325 MG tablet 1/2-1 po bid prn 08/26/16   [provider]  loperamide (IMODIUM A-D) 2 MG tablet Take 1 tablet (2 mg total) by mouth 4 (four) times daily as  needed for diarrhea or loose stools. 01/30/16   Eula Listen, MD  metFORMIN (GLUCOPHAGE-XR) 500 MG 24 hr tablet Take 500 mg by mouth daily with supper.    [provider]  naproxen (NAPROSYN) 500 MG tablet Take 500 mg by mouth 2 (two) times daily as needed for mild pain. Reported on 01/29/2016    [provider]  simvastatin (ZOCOR) 40 MG tablet Take 40 mg by mouth every evening.     [provider]  sulfamethoxazole-trimethoprim (BACTRIM DS) 800-160 MG tablet Take 1 tablet by mouth 2 (two) times daily. 07/21/16   Earleen Newport, MD  triamterene-hydrochlorothiazide (MAXZIDE-25) 37.5-25 MG tablet Take 1 tablet by mouth every evening.     [provider]    Allergies Patient has no known allergies.  No family history on file.  Social History Social History   Tobacco Use  . Smoking status: Never Smoker  . Smokeless tobacco: Never Used  Substance Use Topics  . Alcohol use: No  . Drug use: No    Review of Systems  Constitutional: No fever/chills Eyes: No visual changes. ENT: No sore throat. Cardiovascular: Denies chest pain. Respiratory: Denies shortness of breath. Gastrointestinal: No abdominal pain.  No nausea, no vomiting.  No diarrhea.  No constipation. Genitourinary: Negative for dysuria. Musculoskeletal: Negative for back pain. Skin: Negative for rash. Neurological: Negative for headaches, focal weakness  ____________________________________________   PHYSICAL EXAM:  VITAL SIGNS: ED Triage Vitals  Enc Vitals Group     BP 08/06/19 1109 (!) 167/81     Pulse Rate 08/06/19 1109 75     Resp 08/06/19 1109 20     Temp 08/06/19 1109 98 F (36.7 C)     Temp Source 08/06/19 1109 Oral     SpO2 08/06/19 1109 95 %     Weight 08/06/19 1114 291 lb 0.1 oz (132 kg)     Height 08/06/19 1114 5\' 2"  (1.575 m)     Head Circumference --      Peak Flow --      Pain Score 08/06/19 1116 4     Pain Loc --      Pain Edu? --      Excl. in  [place]? --     Constitutional: Alert and oriented. Well appearing and in no acute distress. Eyes: Conjunctivae are normal Head: Atraumatic except for 2 areas of redness about half the size of my palm at the base of her neck and 1 in the size recorder just below the vertex.  There are some small pustules about 4- 5 mm at the base of the neck and one area of honey colored crust in the one on the top of the head.  The spots on the anterior surface of the leg about the size of my palm they are erythematous warm there is some clear blisters there and 1 that is broken.  Patient reports all these areas are very itchy. Nose: No congestion/rhinnorhea. Mouth/Throat: Mucous membranes are moist.  Oropharynx non-erythematous. Neck: No stridor.  Cardiovascular: Normal rate, regular rhythm. Grossly normal heart sounds.  Good peripheral circulation. Respiratory: Normal respiratory effort.  No retractions. Lungs CTAB. Gastrointestinal: Soft and nontender. No distention. No abdominal bruits.  Musculoskeletal: No lower extremity tenderness nor edema.   Neurologic:  Normal speech and language. No gross focal neurologic deficits are appreciated. No gait instability. Skin:  Skin is warm, dry and intact except as noted in the head.  The rash is described in head as well. Psychiatric: Mood and affect are normal. Speech and behavior are normal.  ____________________________________________   LABS (all labs ordered are listed, but only abnormal results are displayed)  Labs Reviewed  CBC WITH DIFFERENTIAL/PLATELET - Abnormal; Notable for the following components:      Result Value   Platelets 141 (*)    All other components within normal limits  COMPREHENSIVE METABOLIC PANEL - Abnormal; Notable for the following components:   Glucose, Bld 161 (*)    Total Protein 6.4 (*)    All other components within normal limits   ____________________________________________  EKG    ____________________________________________  RADIOLOGY  ED MD interpretation:   Official radiology report(s): No results found.  ____________________________________________   PROCEDURES  Procedure(s) performed (including Critical Care):  Procedures   ____________________________________________   INITIAL IMPRESSION / ASSESSMENT AND PLAN / ED COURSE     Judith Mitchell was evaluated in Emergency Department on 08/06/2019 for the symptoms described in the history of present illness. She was evaluated in the context of the global COVID-19 pandemic, which necessitated consideration that the patient might be at risk for infection with the SARS-CoV-2 virus that causes COVID-19. Institutional protocols and algorithms that pertain to the evaluation of patients at risk for COVID-19 are in a state of rapid change based on information released by regulatory bodies including the CDC and federal and state organizations. These policies and  algorithms were followed during the patient's care in the ED.          ____________________________________________   FINAL CLINICAL IMPRESSION(S) / ED DIAGNOSES  Final diagnoses:  Impetigo     ED Discharge Orders         Ordered    clindamycin (CLEOCIN) 300 MG capsule  3 times daily     08/06/19 1236           Note:  This document was prepared using Dragon voice recognition software and may include unintentional dictation errors.    Nena Polio, MD 08/06/19 [phone]

## 2019-08-09 DIAGNOSIS — I10 Essential (primary) hypertension: Secondary | ICD-10-CM | POA: Diagnosis not present

## 2019-08-09 DIAGNOSIS — L03119 Cellulitis of unspecified part of limb: Secondary | ICD-10-CM | POA: Diagnosis not present

## 2019-08-09 DIAGNOSIS — E11620 Type 2 diabetes mellitus with diabetic dermatitis: Secondary | ICD-10-CM | POA: Diagnosis not present

## 2019-08-09 DIAGNOSIS — E785 Hyperlipidemia, unspecified: Secondary | ICD-10-CM | POA: Diagnosis not present

## 2019-08-09 DIAGNOSIS — Z6841 Body Mass Index (BMI) 40.0 and over, adult: Secondary | ICD-10-CM | POA: Diagnosis not present

## 2019-08-09 DIAGNOSIS — L02419 Cutaneous abscess of limb, unspecified: Secondary | ICD-10-CM | POA: Diagnosis not present

## 2019-08-17 ENCOUNTER — Other Ambulatory Visit: Payer: Self-pay | Admitting: Family Medicine

## 2019-08-17 DIAGNOSIS — Z1231 Encounter for screening mammogram for malignant neoplasm of breast: Secondary | ICD-10-CM

## 2019-09-06 DIAGNOSIS — I872 Venous insufficiency (chronic) (peripheral): Secondary | ICD-10-CM | POA: Diagnosis not present

## 2019-09-06 DIAGNOSIS — I83218 Varicose veins of right lower extremity with both ulcer of other part of lower extremity and inflammation: Secondary | ICD-10-CM | POA: Diagnosis not present

## 2019-09-06 DIAGNOSIS — L97919 Non-pressure chronic ulcer of unspecified part of right lower leg with unspecified severity: Secondary | ICD-10-CM | POA: Diagnosis not present

## 2019-09-06 DIAGNOSIS — L97929 Non-pressure chronic ulcer of unspecified part of left lower leg with unspecified severity: Secondary | ICD-10-CM | POA: Diagnosis not present

## 2019-09-06 DIAGNOSIS — I83228 Varicose veins of left lower extremity with both ulcer of other part of lower extremity and inflammation: Secondary | ICD-10-CM | POA: Diagnosis not present

## 2019-09-10 DIAGNOSIS — S81801A Unspecified open wound, right lower leg, initial encounter: Secondary | ICD-10-CM | POA: Diagnosis not present

## 2019-10-26 DIAGNOSIS — Z03818 Encounter for observation for suspected exposure to other biological agents ruled out: Secondary | ICD-10-CM | POA: Diagnosis not present

## 2019-10-26 DIAGNOSIS — J0100 Acute maxillary sinusitis, unspecified: Secondary | ICD-10-CM | POA: Diagnosis not present

## 2019-10-26 DIAGNOSIS — I10 Essential (primary) hypertension: Secondary | ICD-10-CM | POA: Diagnosis not present

## 2019-11-01 ENCOUNTER — Encounter: Payer: Self-pay | Admitting: Emergency Medicine

## 2019-11-01 ENCOUNTER — Emergency Department: Payer: PPO

## 2019-11-01 ENCOUNTER — Other Ambulatory Visit: Payer: Self-pay

## 2019-11-01 ENCOUNTER — Emergency Department
Admission: EM | Admit: 2019-11-01 | Discharge: 2019-11-01 | Disposition: A | Discharge status: Home / Self Care | Payer: PPO | Attending: Emergency Medicine | Admitting: Emergency Medicine

## 2019-11-01 DIAGNOSIS — R1013 Epigastric pain: Secondary | ICD-10-CM | POA: Diagnosis not present

## 2019-11-01 DIAGNOSIS — R110 Nausea: Secondary | ICD-10-CM | POA: Diagnosis not present

## 2019-11-01 DIAGNOSIS — M6281 Muscle weakness (generalized): Secondary | ICD-10-CM | POA: Diagnosis not present

## 2019-11-01 DIAGNOSIS — U071 COVID-19: Secondary | ICD-10-CM | POA: Insufficient documentation

## 2019-11-01 DIAGNOSIS — I10 Essential (primary) hypertension: Secondary | ICD-10-CM | POA: Diagnosis not present

## 2019-11-01 DIAGNOSIS — R079 Chest pain, unspecified: Secondary | ICD-10-CM | POA: Diagnosis not present

## 2019-11-01 DIAGNOSIS — Z79899 Other long term (current) drug therapy: Secondary | ICD-10-CM | POA: Diagnosis not present

## 2019-11-01 DIAGNOSIS — E119 Type 2 diabetes mellitus without complications: Secondary | ICD-10-CM | POA: Insufficient documentation

## 2019-11-01 DIAGNOSIS — R0789 Other chest pain: Secondary | ICD-10-CM | POA: Diagnosis not present

## 2019-11-01 LAB — BASIC METABOLIC PANEL
Anion gap: 13 (ref 5–15)
BUN: 20 mg/dL (ref 8–23)
CO2: 25 mmol/L (ref 22–32)
Calcium: 9.4 mg/dL (ref 8.9–10.3)
Chloride: 101 mmol/L (ref 98–111)
Creatinine, Ser: 0.81 mg/dL (ref 0.44–1.00)
GFR calc Af Amer: >60 mL/min (ref >60)
GFR calc non Af Amer: >60 mL/min (ref >60)
Glucose, Bld: 206 mg/dL — ABNORMAL HIGH (ref 70–99)
Potassium: 3 mmol/L — ABNORMAL LOW (ref 3.5–5.1)
Sodium: 139 mmol/L (ref 135–145)

## 2019-11-01 LAB — CBC
HCT: 38 % (ref 36.0–46.0)
Hemoglobin: 13.4 g/dL (ref 12.0–15.0)
MCH: 28.3 pg (ref 26.0–34.0)
MCHC: 35.3 g/dL (ref 30.0–36.0)
MCV: 80.2 fL (ref 80.0–100.0)
Platelets: 120 10*3/uL — ABNORMAL LOW (ref 150–400)
RBC: 4.74 MIL/uL (ref 3.87–5.11)
RDW: 13.6 % (ref 11.5–15.5)
WBC: 3.4 10*3/uL — ABNORMAL LOW (ref 4.0–10.5)
nRBC: 0 % (ref 0.0–0.2)

## 2019-11-01 LAB — TROPONIN I (HIGH SENSITIVITY): Troponin I (High Sensitivity): 5 ng/L (ref <18)

## 2019-11-01 MED ORDER — LOPERAMIDE HCL 2 MG PO CAPS: 2.0000 mg | ORAL_CAPSULE | ORAL | 0 refills | Status: DC | PRN

## 2019-11-01 MED ORDER — ONDANSETRON 8 MG PO TBDP: 8.0000 mg | ORAL_TABLET | Freq: Three times a day (TID) | ORAL | 0 refills | Status: DC | PRN

## 2019-11-01 MED ORDER — FAMOTIDINE 20 MG PO TABS: 20.0000 mg | ORAL_TABLET | Freq: Two times a day (BID) | ORAL | 0 refills | Status: AC

## 2019-11-01 MED ORDER — PREDNISONE 20 MG PO TABS: 40.0000 mg | ORAL_TABLET | Freq: Every day | ORAL | 0 refills | Status: AC

## 2019-11-01 MED ORDER — ALBUTEROL SULFATE HFA 108 (90 BASE) MCG/ACT IN AERS: 2.0000 | INHALATION_SPRAY | Freq: Four times a day (QID) | RESPIRATORY_TRACT | 0 refills | Status: DC | PRN

## 2019-11-01 NOTE — ED Provider Notes (Signed)
[facility] Emergency Department Provider Note ____________________________________________   First MD Initiated Contact with Patient 11/01/19 1357     (approximate)  I have reviewed the triage vital signs and the nursing notes.   HISTORY  Chief Complaint Chest Pain  Level 5 caveat: History of present illness limited due to hearing impairment.  History obtained by both writing as well as ASL video interpreter  HPI Judith Mitchell is a 69 y.o. female with PMH as noted below who presents with chest pain over the last several days, gradual onset, described as tightness, and associated with nausea and epigastric pain.  The patient was diagnosed with COVID-19 on 12/29.  She also reports generalized weakness and decreased p.o. intake.  She called her PMD today and was instructed to come to the ED for evaluation.  Past Medical History:  Diagnosis Date  . Cellulitis   . Deaf   . Diabetes mellitus without complication ([place])   . High cholesterol   . Hypertension   . Obesity     There are no problems to display for this patient.   Past Surgical History:  Procedure Laterality Date  . ABDOMINAL HYSTERECTOMY    . CHOLECYSTECTOMY    . ESOPHAGOGASTRODUODENOSCOPY (EGD) WITH PROPOFOL N/A 01/29/2016   Procedure: ESOPHAGOGASTRODUODENOSCOPY (EGD) WITH PROPOFOL;  Surgeon: Lollie Sails, MD;  Location: [facility] ENDOSCOPY;  Service: Endoscopy;  Laterality: N/A;    Prior to Admission medications   Medication Sig Start Date End Date Taking? Authorizing Provider  albuterol (VENTOLIN HFA) 108 (90 Base) MCG/ACT inhaler Inhale 2 puffs into the lungs every 6 (six) hours as needed for up to 7 days for shortness of breath. 11/01/19 11/08/19  Arta Silence, MD  famotidine (PEPCID) 20 MG tablet Take 1 tablet (20 mg total) by mouth 2 (two) times daily for 10 days. 11/01/19 11/11/19  Arta Silence, MD  HYDROcodone-acetaminophen (NORCO/VICODIN) 5-325 MG tablet 1/2-1 po bid prn  08/26/16   [provider]  loperamide (IMODIUM) 2 MG capsule Take 1 capsule (2 mg total) by mouth as needed for diarrhea or loose stools. 11/01/19   Arta Silence, MD  metFORMIN (GLUCOPHAGE-XR) 500 MG 24 hr tablet Take 500 mg by mouth daily with supper.    [provider]  naproxen (NAPROSYN) 500 MG tablet Take 500 mg by mouth 2 (two) times daily as needed for mild pain. Reported on 01/29/2016    [provider]  ondansetron (ZOFRAN ODT) 8 MG disintegrating tablet Take 1 tablet (8 mg total) by mouth every 8 (eight) hours as needed for nausea or vomiting. 11/01/19   Arta Silence, MD  predniSONE (DELTASONE) 20 MG tablet Take 2 tablets (40 mg total) by mouth daily with breakfast for 5 days. 11/01/19 11/06/19  Arta Silence, MD  simvastatin (ZOCOR) 40 MG tablet Take 40 mg by mouth every evening.     [provider]  sulfamethoxazole-trimethoprim (BACTRIM DS) 800-160 MG tablet Take 1 tablet by mouth 2 (two) times daily. 07/21/16   Earleen Newport, MD  triamterene-hydrochlorothiazide (MAXZIDE-25) 37.5-25 MG tablet Take 1 tablet by mouth every evening.     [provider]    Allergies Patient has no known allergies.  No family history on file.  Social History Social History   Tobacco Use  . Smoking status: Never Smoker  . Smokeless tobacco: Never Used  Substance Use Topics  . Alcohol use: No  . Drug use: No    Review of Systems Level 5 caveat: Review of systems  limited due to hearing impairment Constitutional: No fever.  Positive for weakness. Cardiovascular: Positive for chest pain. Respiratory: Denies shortness of breath. Gastrointestinal: No vomiting.  Positive for diarrhea. Skin: Negative for rash. Neurological: Negative for headache.   ____________________________________________   PHYSICAL EXAM:  VITAL SIGNS: ED Triage Vitals  Enc Vitals Group     BP 11/01/19 1207 (!) 181/70     Pulse Rate 11/01/19 1207 77      Resp 11/01/19 1207 18     Temp 11/01/19 1438 97.8 F (36.6 C)     Temp Source 11/01/19 1438 Oral     SpO2 11/01/19 1207 98 %     Weight --      Height 11/01/19 1209 5\' 5"  (1.651 m)     Head Circumference --      Peak Flow --      Pain Score 11/01/19 1208 6     Pain Loc --      Pain Edu? --      Excl. in [place]? --     Constitutional: Alert and oriented. Well appearing and in no acute distress. Eyes: Conjunctivae are normal.  Head: Atraumatic. Nose: No congestion/rhinnorhea. Mouth/Throat: Mucous membranes are moist.   Neck: Normal range of motion.  Cardiovascular: Normal rate, regular rhythm.Good peripheral circulation. Respiratory: Normal respiratory effort.  No retractions.  Gastrointestinal: S No distention.  Musculoskeletal:  Extremities warm and well perfused.  Neurologic:  Normal speech and language. No gross focal neurologic deficits are appreciated.  Skin:  Skin is warm and dry. No rash noted. Psychiatric: Mood and affect are normal. Speech and behavior are normal.  ____________________________________________   LABS (all labs ordered are listed, but only abnormal results are displayed)  Labs Reviewed  BASIC METABOLIC PANEL - Abnormal; Notable for the following components:      Result Value   Potassium 3.0 (*)    Glucose, Bld 206 (*)    All other components within normal limits  CBC - Abnormal; Notable for the following components:   WBC 3.4 (*)    Platelets 120 (*)    All other components within normal limits  TROPONIN I (HIGH SENSITIVITY)  TROPONIN I (HIGH SENSITIVITY)   ____________________________________________  EKG  ED ECG REPORT I, Arta Silence, the attending physician, personally viewed and interpreted this ECG.  Date: 11/01/2019 EKG Time: 1219 Rate: 76 Rhythm: normal sinus rhythm QRS Axis: normal Intervals: normal ST/T Wave abnormalities: normal Narrative Interpretation: no evidence of acute  ischemia  ____________________________________________  RADIOLOGY  CXR: Left lower lung atelectasis with no focal infiltrate or other acute abnormalities  ____________________________________________   PROCEDURES  Procedure(s) performed: No  Procedures  Critical Care performed: No ____________________________________________   INITIAL IMPRESSION / ASSESSMENT AND PLAN / ED COURSE  Pertinent labs & imaging results that were available during my care of the patient were reviewed by me and considered in my medical decision making (see chart for details).  69 year old female with PMH as noted above presents with multiple symptoms over the last several days in the context of a COVID-19 diagnosis.  The patient primarily reports chest pain, epigastric discomfort, decreased p.o. intake and appetite, and generalized weakness.  On exam, the patient is overall very well-appearing.  Her vital signs are normal except for hypertension.  She is afebrile and her O2 saturation is 100% on room air.  She has no increased work of breathing or respiratory distress.  Lab work-up is also unremarkable.  Her troponin is negative.  EKG is nonischemic.  Overall presentation is consistent with symptoms related to COVID-19.  At this time, there is no evidence of ACS or other cardiac etiology.  Given the duration of the symptoms, the normal EKG, and the initial negative troponin, there is no indication for a repeat troponin at this time.  There is also no clinical evidence for pulmonary embolism or vascular etiology.  The patient states she feels comfortable going home, and primarily wants prescriptions for symptom control as well as a work note.  At this time, the patient is stable for discharge.  I counseled her on her Covid diagnosis, the nature of her symptoms, how to take care of herself at home, and gave her very thorough return precautions.  She expressed understanding.  Although the patient does not  specifically endorse significant shortness of breath, due to the chest tightness and discomfort, I think there is likely a respiratory component to the patient's symptoms and she is actively coughing.  I will prescribe prednisone and albuterol, as well as Zofran, Pepcid, and loperamide for her GI symptoms.  ____________________________  Judith Mitchell was evaluated in Emergency Department on 11/01/2019 for the symptoms described in the history of present illness. She was evaluated in the context of the global COVID-19 pandemic, which necessitated consideration that the patient might be at risk for infection with the SARS-CoV-2 virus that causes COVID-19. Institutional protocols and algorithms that pertain to the evaluation of patients at risk for COVID-19 are in a state of rapid change based on information released by regulatory bodies including the CDC and federal and state organizations. These policies and algorithms were followed during the patient's care in the ED.  ____________________________________________   FINAL CLINICAL IMPRESSION(S) / ED DIAGNOSES  Final diagnoses:  COVID-19      NEW MEDICATIONS STARTED DURING THIS VISIT:  Discharge Medication List as of 11/01/2019  2:43 PM    START taking these medications   Details  albuterol (VENTOLIN HFA) 108 (90 Base) MCG/ACT inhaler Inhale 2 puffs into the lungs every 6 (six) hours as needed for up to 7 days for shortness of breath., Starting Mon 11/01/2019, Until Mon 11/08/2019, Normal    loperamide (IMODIUM) 2 MG capsule Take 1 capsule (2 mg total) by mouth as needed for diarrhea or loose stools., Starting Mon 11/01/2019, Normal    ondansetron (ZOFRAN ODT) 8 MG disintegrating tablet Take 1 tablet (8 mg total) by mouth every 8 (eight) hours as needed for nausea or vomiting., Starting Mon 11/01/2019, Normal    predniSONE (DELTASONE) 20 MG tablet Take 2 tablets (40 mg total) by mouth daily with breakfast for 5 days., Starting Mon 11/01/2019, Until  Sat 11/06/2019, Normal         Note:  This document was prepared using Dragon voice recognition software and may include unintentional dictation errors.    Arta Silence, MD 11/01/19 [phone]

## 2019-11-01 NOTE — ED Triage Notes (Signed)
This am called dr/nurse at clinic.  They told her to come here due to chest pain and pain in lungs and feels nauseated and stomach pain.  Has not wanted to eat.  Also weak.  Has covid

## 2019-11-01 NOTE — Discharge Instructions (Signed)
Take the prednisone as prescribed for the next 5 days.  This will decrease the inflammation in your lungs.  You may use the albuterol inhaler as needed to help with the cough and chest tightness.  We have also prescribed loperamide for diarrhea, Pepcid for stomach pain, and Zofran as needed for nausea.  Return to the ER immediately for new, worsening, or persistent shortness of breath, worsening chest pain, worsening weakness, persistent vomiting, or any other new or worsening symptoms that concern you.

## 2019-11-16 DIAGNOSIS — E1165 Type 2 diabetes mellitus with hyperglycemia: Secondary | ICD-10-CM | POA: Diagnosis not present

## 2019-11-16 DIAGNOSIS — I10 Essential (primary) hypertension: Secondary | ICD-10-CM | POA: Diagnosis not present

## 2019-11-16 DIAGNOSIS — U071 COVID-19: Secondary | ICD-10-CM | POA: Diagnosis not present

## 2019-11-16 DIAGNOSIS — E876 Hypokalemia: Secondary | ICD-10-CM | POA: Diagnosis not present

## 2020-01-13 DIAGNOSIS — M545 Low back pain: Secondary | ICD-10-CM | POA: Diagnosis not present

## 2020-02-13 ENCOUNTER — Other Ambulatory Visit: Payer: Self-pay

## 2020-02-13 ENCOUNTER — Encounter: Payer: Self-pay | Admitting: Emergency Medicine

## 2020-02-13 ENCOUNTER — Emergency Department: Payer: PPO

## 2020-02-13 ENCOUNTER — Emergency Department
Admission: EM | Admit: 2020-02-13 | Discharge: 2020-02-13 | Disposition: A | Discharge status: Home / Self Care | Payer: PPO | Attending: Emergency Medicine | Admitting: Emergency Medicine

## 2020-02-13 DIAGNOSIS — I872 Venous insufficiency (chronic) (peripheral): Secondary | ICD-10-CM | POA: Diagnosis not present

## 2020-02-13 DIAGNOSIS — E119 Type 2 diabetes mellitus without complications: Secondary | ICD-10-CM | POA: Insufficient documentation

## 2020-02-13 DIAGNOSIS — Z79899 Other long term (current) drug therapy: Secondary | ICD-10-CM | POA: Diagnosis not present

## 2020-02-13 DIAGNOSIS — L03115 Cellulitis of right lower limb: Secondary | ICD-10-CM | POA: Diagnosis not present

## 2020-02-13 DIAGNOSIS — I10 Essential (primary) hypertension: Secondary | ICD-10-CM | POA: Diagnosis not present

## 2020-02-13 DIAGNOSIS — Z7984 Long term (current) use of oral hypoglycemic drugs: Secondary | ICD-10-CM | POA: Insufficient documentation

## 2020-02-13 DIAGNOSIS — I878 Other specified disorders of veins: Secondary | ICD-10-CM | POA: Insufficient documentation

## 2020-02-13 DIAGNOSIS — R600 Localized edema: Secondary | ICD-10-CM | POA: Diagnosis not present

## 2020-02-13 DIAGNOSIS — M79661 Pain in right lower leg: Secondary | ICD-10-CM | POA: Diagnosis not present

## 2020-02-13 DIAGNOSIS — R2243 Localized swelling, mass and lump, lower limb, bilateral: Secondary | ICD-10-CM | POA: Diagnosis present

## 2020-02-13 LAB — CBC WITH DIFFERENTIAL/PLATELET
Abs Immature Granulocytes: 0.01 10*3/uL (ref 0.00–0.07)
Basophils Absolute: 0 10*3/uL (ref 0.0–0.1)
Basophils Relative: 0 %
Eosinophils Absolute: 0.2 10*3/uL (ref 0.0–0.5)
Eosinophils Relative: 5 %
HCT: 37.7 % (ref 36.0–46.0)
Hemoglobin: 12.9 g/dL (ref 12.0–15.0)
Immature Granulocytes: 0 %
Lymphocytes Relative: 24 %
Lymphs Abs: 1.1 10*3/uL (ref 0.7–4.0)
MCH: 28.5 pg (ref 26.0–34.0)
MCHC: 34.2 g/dL (ref 30.0–36.0)
MCV: 83.4 fL (ref 80.0–100.0)
Monocytes Absolute: 0.3 10*3/uL (ref 0.1–1.0)
Monocytes Relative: 8 %
Neutro Abs: 2.7 10*3/uL (ref 1.7–7.7)
Neutrophils Relative %: 63 %
Platelets: 144 10*3/uL — ABNORMAL LOW (ref 150–400)
RBC: 4.52 MIL/uL (ref 3.87–5.11)
RDW: 13.2 % (ref 11.5–15.5)
WBC: 4.4 10*3/uL (ref 4.0–10.5)
nRBC: 0 % (ref 0.0–0.2)

## 2020-02-13 LAB — BASIC METABOLIC PANEL
Anion gap: 7 (ref 5–15)
BUN: 15 mg/dL (ref 8–23)
CO2: 28 mmol/L (ref 22–32)
Calcium: 9.3 mg/dL (ref 8.9–10.3)
Chloride: 105 mmol/L (ref 98–111)
Creatinine, Ser: 0.72 mg/dL (ref 0.44–1.00)
GFR calc Af Amer: >60 mL/min (ref >60)
GFR calc non Af Amer: >60 mL/min (ref >60)
Glucose, Bld: 232 mg/dL — ABNORMAL HIGH (ref 70–99)
Potassium: 3.2 mmol/L — ABNORMAL LOW (ref 3.5–5.1)
Sodium: 140 mmol/L (ref 135–145)

## 2020-02-13 LAB — BRAIN NATRIURETIC PEPTIDE: B Natriuretic Peptide: 76 pg/mL (ref 0.0–100.0)

## 2020-02-13 LAB — LACTIC ACID, PLASMA: Lactic Acid, Venous: 1.6 mmol/L (ref 0.5–1.9)

## 2020-02-13 MED ORDER — SULFAMETHOXAZOLE-TRIMETHOPRIM 800-160 MG PO TABS: 1.0000 | ORAL_TABLET | Freq: Two times a day (BID) | ORAL | 0 refills | Status: DC

## 2020-02-13 MED ORDER — SULFAMETHOXAZOLE-TRIMETHOPRIM 800-160 MG PO TABS
1.0000 | ORAL_TABLET | Freq: Once | ORAL | Status: AC
  Administered 2020-02-13: 1 via ORAL
  Filled 2020-02-13: qty 1

## 2020-02-13 MED ORDER — CLINDAMYCIN HCL 150 MG PO CAPS
300.0000 mg | ORAL_CAPSULE | Freq: Once | ORAL | Status: AC
  Administered 2020-02-13: 17:00:00 300 mg via ORAL
  Filled 2020-02-13: qty 2

## 2020-02-13 NOTE — ED Triage Notes (Signed)
Pt to ED via POV c/o bilateral leg swelling with blisters. Pt states that legs have been swollen for about a week but it has gotten worse. Pt states that she is now having pain, unable to sleep last night due to the pain. Pt denies shortness of breath. Pt is in NAD.

## 2020-02-13 NOTE — ED Provider Notes (Signed)
[facility] Emergency Department Provider Note  ____________________________________________  Time seen: Approximately 12:38 PM  I have reviewed the triage vital signs and the nursing notes.   HISTORY  Chief Complaint Leg Swelling  ASL interpreter was used with video interpreter  HPI Judith Mitchell is a 69 y.o. female who presents the emergency department complaining of bilateral lower extremity edema worse on the right than left.  Right calf pain, as well as vesicle formation bilateral lower extremities.  Patient states that symptoms have been progressively worsening over the past 2 to 3 weeks.  She states that she has a  history of diabetes on Metformin, hypertension, hypercholesterolemia.  Patient states that she has never had any CHF.  Patient states that she had swelling, pain, redness in one extremity roughly 6 years ago and was diagnosed with an infection that was easily treated with antibiotics.  No history of recurrent pedal edema.  Patient denies any fevers or chills, URI symptoms, chest pain, shortness of breath, pleuritic pain, abdominal pain, nausea vomiting, diarrhea or constipation, urinary complaints.  Patient's only complaint is bilateral lower extremity edema below the knee bilaterally, vesicle formation and pain.  Patient does not take any medication for this complaint prior to arrival.        Past Medical History:  Diagnosis Date  . Cellulitis   . Deaf   . Diabetes mellitus without complication ([place])   . High cholesterol   . Hypertension   . Obesity     There are no problems to display for this patient.   Past Surgical History:  Procedure Laterality Date  . ABDOMINAL HYSTERECTOMY    . CHOLECYSTECTOMY    . ESOPHAGOGASTRODUODENOSCOPY (EGD) WITH PROPOFOL N/A 01/29/2016   Procedure: ESOPHAGOGASTRODUODENOSCOPY (EGD) WITH PROPOFOL;  Surgeon: Lollie Sails, MD;  Location: [facility] ENDOSCOPY;  Service: Endoscopy;  Laterality: N/A;    Prior  to Admission medications   Medication Sig Start Date End Date Taking? Authorizing Provider  albuterol (VENTOLIN HFA) 108 (90 Base) MCG/ACT inhaler Inhale 2 puffs into the lungs every 6 (six) hours as needed for up to 7 days for shortness of breath. 11/01/19 11/08/19  Arta Silence, MD  famotidine (PEPCID) 20 MG tablet Take 1 tablet (20 mg total) by mouth 2 (two) times daily for 10 days. 11/01/19 11/11/19  Arta Silence, MD  HYDROcodone-acetaminophen (NORCO/VICODIN) 5-325 MG tablet 1/2-1 po bid prn 08/26/16   [provider]  loperamide (IMODIUM) 2 MG capsule Take 1 capsule (2 mg total) by mouth as needed for diarrhea or loose stools. 11/01/19   Arta Silence, MD  metFORMIN (GLUCOPHAGE-XR) 500 MG 24 hr tablet Take 500 mg by mouth daily with supper.    [provider]  naproxen (NAPROSYN) 500 MG tablet Take 500 mg by mouth 2 (two) times daily as needed for mild pain. Reported on 01/29/2016    [provider]  ondansetron (ZOFRAN ODT) 8 MG disintegrating tablet Take 1 tablet (8 mg total) by mouth every 8 (eight) hours as needed for nausea or vomiting. 11/01/19   Arta Silence, MD  simvastatin (ZOCOR) 40 MG tablet Take 40 mg by mouth every evening.     [provider]  sulfamethoxazole-trimethoprim (BACTRIM DS) 800-160 MG tablet Take 1 tablet by mouth 2 (two) times daily. 02/13/20   Skyann Ganim, Charline Bills, PA-C  triamterene-hydrochlorothiazide (MAXZIDE-25) 37.5-25 MG tablet Take 1 tablet by mouth every evening.     [provider]    Allergies Patient has no known allergies.  No family history on file.  Social History Social History   Tobacco Use  . Smoking status: Never Smoker  . Smokeless tobacco: Never Used  Substance Use Topics  . Alcohol use: No  . Drug use: No     Review of Systems  Constitutional: No fever/chills Eyes: No visual changes. No discharge ENT: No upper respiratory complaints. Cardiovascular: no chest  pain. Respiratory: no cough. No SOB. Gastrointestinal: No abdominal pain.  No nausea, no vomiting.  No diarrhea.  No constipation. Genitourinary: Negative for dysuria. No hematuria Musculoskeletal: Bilateral lower extremity edema, worse on the right than left.  Right calf pain.  Erythema and vesicle formation to the right lower extremity. Skin: Negative for rash, abrasions, lacerations, ecchymosis. Neurological: Negative for headaches, focal weakness or numbness. 10-point ROS otherwise negative.  ____________________________________________   PHYSICAL EXAM:  VITAL SIGNS: ED Triage Vitals  Enc Vitals Group     BP 02/13/20 1121 (!) 210/84     Pulse Rate 02/13/20 1121 77     Resp 02/13/20 1121 16     Temp 02/13/20 1121 98 F (36.7 C)     Temp Source 02/13/20 1121 Oral     SpO2 02/13/20 1121 94 %     Weight 02/13/20 1116 289 lb (131.1 kg)     Height 02/13/20 1116 5\' 5"  (1.651 m)     Head Circumference --      Peak Flow --      Pain Score 02/13/20 1115 7     Pain Loc --      Pain Edu? --      Excl. in [place]? --      Constitutional: Alert and oriented. Well appearing and in no acute distress. Eyes: Conjunctivae are normal. PERRL. EOMI. Head: Atraumatic. ENT:      Ears:       Nose: No congestion/rhinnorhea.      Mouth/Throat: Mucous membranes are moist.  Neck: No stridor.    Cardiovascular: Normal rate, regular rhythm. Normal S1 and S2.  Good peripheral circulation. Respiratory: Normal respiratory effort without tachypnea or retractions. Lungs CTAB. Good air entry to the bases with no decreased or absent breath sounds. Musculoskeletal: Full range of motion to all extremities. No gross deformities appreciated.  Visualization of bilateral lower extremities reveals mild edema to left lower extremity, moderate to significant edema of the right lower extremity.  Patient has skin changes to the distal anterior shin.  Patient has vesicle formation with surrounding erythema on the right  side, mild erythema without significant vesicle formation to the left side.  Patient has unremarkable exams of bilateral upper legs.  Visualization of the right calf reveals mild erythema.  Calf is tender to palpation.  Other than palpable edema, no other palpable findings to the right calf.  Erythematous vesicular region is tender to palpation but no underlying fluctuance or induration.  Dorsalis pedis pulse appreciated distally.  Examination of the left lower extremity reveals no tenderness over the left lower extremity.  Mild skin changes with erythema along the distal anterior shin.  Minimally tender to palpation.  No fluctuance or induration.  Dorsalis pedis pulse intact. Neurologic:  Normal speech and language. No gross focal neurologic deficits are appreciated.  Skin:  Skin is warm, dry and intact. No rash noted. Psychiatric: Mood and affect are normal. Speech and behavior are normal. Patient exhibits appropriate insight and judgement.   ____________________________________________   LABS (all labs ordered are listed, but only abnormal results are displayed)  Labs Reviewed  BASIC METABOLIC PANEL - Abnormal; Notable for the following components:      Result Value   Potassium 3.2 (*)    Glucose, Bld 232 (*)    All other components within normal limits  CBC WITH DIFFERENTIAL/PLATELET - Abnormal; Notable for the following components:   Platelets 144 (*)    All other components within normal limits  CULTURE, BLOOD (ROUTINE X 2)  BRAIN NATRIURETIC PEPTIDE  LACTIC ACID, PLASMA   ____________________________________________  EKG   ____________________________________________  RADIOLOGY I personally viewed and evaluated these images as part of my medical decision making, as well as reviewing the written report by the radiologist.  US Venous Img Lower Bilateral  Result Date: 02/13/2020 CLINICAL DATA:  Acute right calf pain and bilateral lower extremity edema. EXAM: Bilateral LOWER  EXTREMITY VENOUS DOPPLER ULTRASOUND TECHNIQUE: Gray-scale sonography with compression, as well as color and duplex ultrasound, were performed to evaluate the deep venous system(s) from the level of the common femoral vein through the popliteal and proximal calf veins. COMPARISON:  None. FINDINGS: VENOUS Normal compressibility of the common femoral, superficial femoral, and popliteal veins, as well as the visualized calf veins. Visualized portions of profunda femoral vein and great saphenous vein unremarkable. No filling defects to suggest DVT on grayscale or color Doppler imaging. Doppler waveforms show normal direction of venous flow, normal respiratory phasicity and response to augmentation. Limited views of the contralateral common femoral vein are unremarkable. OTHER None. Limitations: none IMPRESSION: No femoropopliteal DVT nor evidence of DVT within the visualized calf veins. If clinical symptoms are inconsistent or if there are persistent or worsening symptoms, further imaging (possibly involving the iliac veins) may be warranted. Electronically Signed   By: Marijo Conception M.D.   On: 02/13/2020 14:38    ____________________________________________    PROCEDURES  Procedure(s) performed:    Procedures    Medications  clindamycin (CLEOCIN) capsule 300 mg (has no administration in time range)  sulfamethoxazole-trimethoprim (BACTRIM DS) 800-160 MG per tablet 1 tablet (has no administration in time range)     ____________________________________________   INITIAL IMPRESSION / ASSESSMENT AND PLAN / ED COURSE  Pertinent labs & imaging results that were available during my care of the patient were reviewed by me and considered in my medical decision making (see chart for details).  Review of the [place] CSRS was performed in accordance of the [place] prior to dispensing any controlled drugs.           Patient's diagnosis is consistent with venous stasis ulcer with mild cellulitis.  Patient  presented to emergency department complaining of bilateral lower extremity edema, skin changes.  On initial exam differential included cellulitis, venous stasis ulcer with infection, DVT, CHF.  Labs are reassuring at this time.  Ultrasound reveals no DVT.  Patient will be treated with antibiotics for what appears to be venous stasis ulcers with mild cellulitis to the right lower extremity.  Follow-up with primary care..  Patient is given ED precautions to return to the ED for any worsening or new symptoms.     ____________________________________________  FINAL CLINICAL IMPRESSION(S) / ED DIAGNOSES  Final diagnoses:  Venous stasis of lower extremity  Cellulitis of right lower extremity      NEW MEDICATIONS STARTED DURING THIS VISIT:  ED Discharge Orders         Ordered    sulfamethoxazole-trimethoprim (BACTRIM DS) 800-160 MG tablet  2 times daily     02/13/20 1656  This chart was dictated using voice recognition software/Dragon. Despite best efforts to proofread, errors can occur which can change the meaning. Any change was purely unintentional.    Darletta Moll, PA-C 02/13/20 1657    Nance Pear, MD 02/14/20 1309

## 2020-02-13 NOTE — ED Notes (Signed)
Pt was triaged using [place] ID [id number]

## 2020-02-18 LAB — CULTURE, BLOOD (ROUTINE X 2): Culture: NO GROWTH

## 2020-03-13 DIAGNOSIS — M25561 Pain in right knee: Secondary | ICD-10-CM | POA: Diagnosis not present

## 2020-03-13 DIAGNOSIS — M25551 Pain in right hip: Secondary | ICD-10-CM | POA: Diagnosis not present

## 2020-05-05 DIAGNOSIS — H35372 Puckering of macula, left eye: Secondary | ICD-10-CM | POA: Diagnosis not present

## 2020-06-21 DIAGNOSIS — H2512 Age-related nuclear cataract, left eye: Secondary | ICD-10-CM | POA: Diagnosis not present

## 2020-06-21 DIAGNOSIS — E119 Type 2 diabetes mellitus without complications: Secondary | ICD-10-CM | POA: Diagnosis not present

## 2020-07-04 ENCOUNTER — Other Ambulatory Visit: Payer: Self-pay

## 2020-07-04 ENCOUNTER — Other Ambulatory Visit
Admission: RE | Admit: 2020-07-04 | Discharge: 2020-07-04 | Disposition: A | Discharge status: Home / Self Care | Payer: PPO | Source: Ambulatory Visit | Attending: Ophthalmology | Admitting: Ophthalmology

## 2020-07-04 DIAGNOSIS — Z20822 Contact with and (suspected) exposure to covid-19: Secondary | ICD-10-CM | POA: Insufficient documentation

## 2020-07-04 DIAGNOSIS — Z01812 Encounter for preprocedural laboratory examination: Secondary | ICD-10-CM | POA: Diagnosis not present

## 2020-07-05 ENCOUNTER — Encounter: Payer: Self-pay | Admitting: Ophthalmology

## 2020-07-05 LAB — SARS CORONAVIRUS 2 (TAT 6-24 HRS): SARS Coronavirus 2: NEGATIVE

## 2020-07-06 ENCOUNTER — Ambulatory Visit: Payer: PPO | Admitting: Anesthesiology

## 2020-07-06 ENCOUNTER — Encounter: Payer: Self-pay | Admitting: Ophthalmology

## 2020-07-06 ENCOUNTER — Encounter
Admission: RE | Disposition: A | Discharge status: Home / Self Care | Payer: Self-pay | Source: Home / Self Care | Attending: Ophthalmology

## 2020-07-06 ENCOUNTER — Other Ambulatory Visit: Payer: Self-pay

## 2020-07-06 ENCOUNTER — Ambulatory Visit
Admission: RE | Admit: 2020-07-06 | Discharge: 2020-07-06 | Disposition: A | Discharge status: Home / Self Care | Payer: PPO | Attending: Ophthalmology | Admitting: Ophthalmology

## 2020-07-06 DIAGNOSIS — M1990 Unspecified osteoarthritis, unspecified site: Secondary | ICD-10-CM | POA: Insufficient documentation

## 2020-07-06 DIAGNOSIS — E7800 Pure hypercholesterolemia, unspecified: Secondary | ICD-10-CM | POA: Insufficient documentation

## 2020-07-06 DIAGNOSIS — Z791 Long term (current) use of non-steroidal anti-inflammatories (NSAID): Secondary | ICD-10-CM | POA: Diagnosis not present

## 2020-07-06 DIAGNOSIS — Z7984 Long term (current) use of oral hypoglycemic drugs: Secondary | ICD-10-CM | POA: Diagnosis not present

## 2020-07-06 DIAGNOSIS — H9190 Unspecified hearing loss, unspecified ear: Secondary | ICD-10-CM | POA: Diagnosis not present

## 2020-07-06 DIAGNOSIS — E1136 Type 2 diabetes mellitus with diabetic cataract: Secondary | ICD-10-CM | POA: Insufficient documentation

## 2020-07-06 DIAGNOSIS — Z6832 Body mass index (BMI) 32.0-32.9, adult: Secondary | ICD-10-CM | POA: Insufficient documentation

## 2020-07-06 DIAGNOSIS — Z79899 Other long term (current) drug therapy: Secondary | ICD-10-CM | POA: Insufficient documentation

## 2020-07-06 DIAGNOSIS — H2512 Age-related nuclear cataract, left eye: Secondary | ICD-10-CM | POA: Diagnosis not present

## 2020-07-06 DIAGNOSIS — I10 Essential (primary) hypertension: Secondary | ICD-10-CM | POA: Insufficient documentation

## 2020-07-06 DIAGNOSIS — E669 Obesity, unspecified: Secondary | ICD-10-CM | POA: Insufficient documentation

## 2020-07-06 HISTORY — DX: Unspecified osteoarthritis, unspecified site: M19.90

## 2020-07-06 HISTORY — PX: CATARACT EXTRACTION W/PHACO: SHX586

## 2020-07-06 LAB — GLUCOSE, CAPILLARY
Glucose-Capillary: 125 mg/dL — ABNORMAL HIGH (ref 70–99)
Glucose-Capillary: 129 mg/dL — ABNORMAL HIGH (ref 70–99)

## 2020-07-06 SURGERY — PHACOEMULSIFICATION, CATARACT, WITH IOL INSERTION
Anesthesia: Monitor Anesthesia Care | Site: Eye | Laterality: Left

## 2020-07-06 MED ORDER — FENTANYL CITRATE (PF) 100 MCG/2ML IJ SOLN
INTRAMUSCULAR | Status: AC
  Filled 2020-07-06: qty 2

## 2020-07-06 MED ORDER — EPINEPHRINE PF 1 MG/ML IJ SOLN: INTRAOCULAR | Status: DC | PRN

## 2020-07-06 MED ORDER — MOXIFLOXACIN HCL 0.5 % OP SOLN: 1.0000 [drp] | OPHTHALMIC | Status: DC | PRN

## 2020-07-06 MED ORDER — LIDOCAINE HCL (PF) 4 % IJ SOLN
INTRAMUSCULAR | Status: AC
  Filled 2020-07-06: qty 5

## 2020-07-06 MED ORDER — NA CHONDROIT SULF-NA HYALURON 40-17 MG/ML IO SOLN
INTRAOCULAR | Status: DC | PRN
  Administered 2020-07-06: 1 mL via INTRAOCULAR

## 2020-07-06 MED ORDER — POVIDONE-IODINE 5 % OP SOLN
OPHTHALMIC | Status: DC | PRN
  Administered 2020-07-06: 1 via OPHTHALMIC

## 2020-07-06 MED ORDER — LIDOCAINE HCL (PF) 4 % IJ SOLN
INTRAOCULAR | Status: DC | PRN
  Administered 2020-07-06: 4 mL via OPHTHALMIC

## 2020-07-06 MED ORDER — TETRACAINE HCL 0.5 % OP SOLN: 1.0000 [drp] | OPHTHALMIC | Status: DC | PRN

## 2020-07-06 MED ORDER — FENTANYL CITRATE (PF) 100 MCG/2ML IJ SOLN
INTRAMUSCULAR | Status: DC | PRN
  Administered 2020-07-06: 25 ug via INTRAVENOUS

## 2020-07-06 MED ORDER — MIDAZOLAM HCL 2 MG/2ML IJ SOLN
INTRAMUSCULAR | Status: AC
  Filled 2020-07-06: qty 2

## 2020-07-06 MED ORDER — POVIDONE-IODINE 5 % OP SOLN
OPHTHALMIC | Status: AC
  Filled 2020-07-06: qty 30

## 2020-07-06 MED ORDER — ARMC OPHTHALMIC DILATING DROPS
1.0000 "application " | Freq: Every day | OPHTHALMIC | Status: AC | PRN
  Administered 2020-07-06 (×2): 1 via OPHTHALMIC

## 2020-07-06 MED ORDER — CARBACHOL 0.01 % IO SOLN
INTRAOCULAR | Status: DC | PRN
  Administered 2020-07-06: 0.5 mL via INTRAOCULAR

## 2020-07-06 MED ORDER — NA CHONDROIT SULF-NA HYALURON 40-17 MG/ML IO SOLN
INTRAOCULAR | Status: AC
  Filled 2020-07-06: qty 1

## 2020-07-06 MED ORDER — SODIUM CHLORIDE 0.9 % IV SOLN: INTRAVENOUS | Status: DC

## 2020-07-06 MED ORDER — MOXIFLOXACIN HCL 0.5 % OP SOLN
OPHTHALMIC | Status: DC | PRN
  Administered 2020-07-06: 0.2 mL via OPHTHALMIC

## 2020-07-06 MED ORDER — ARMC OPHTHALMIC DILATING DROPS
OPHTHALMIC | Status: AC
  Administered 2020-07-06: 1 via OPHTHALMIC
  Filled 2020-07-06: qty 0.5

## 2020-07-06 MED ORDER — MOXIFLOXACIN HCL 0.5 % OP SOLN
OPHTHALMIC | Status: AC
  Filled 2020-07-06: qty 3

## 2020-07-06 MED ORDER — TETRACAINE HCL 0.5 % OP SOLN
OPHTHALMIC | Status: AC
  Administered 2020-07-06: 1 [drp] via OPHTHALMIC
  Filled 2020-07-06: qty 4

## 2020-07-06 MED ORDER — MIDAZOLAM HCL 2 MG/2ML IJ SOLN
INTRAMUSCULAR | Status: DC | PRN
  Administered 2020-07-06 (×2): 1 mg via INTRAVENOUS

## 2020-07-06 MED ORDER — EPINEPHRINE PF 1 MG/ML IJ SOLN
INTRAMUSCULAR | Status: AC
  Filled 2020-07-06: qty 2

## 2020-07-06 SURGICAL SUPPLY — 16 items
GLOVE BIO SURGEON STRL SZ8 (GLOVE) ×3 IMPLANT
GLOVE BIOGEL M 6.5 STRL (GLOVE) ×3 IMPLANT
GLOVE SURG LX 8.0 MICRO (GLOVE) ×2
GLOVE SURG LX STRL 8.0 MICRO (GLOVE) ×1 IMPLANT
GOWN STRL REUS W/ TWL LRG LVL3 (GOWN DISPOSABLE) ×2 IMPLANT
GOWN STRL REUS W/TWL LRG LVL3 (GOWN DISPOSABLE) ×6
LABEL CATARACT MEDS ST (LABEL) ×3 IMPLANT
LENS IOL TECNIS EYHANCE 20.5 ×2 IMPLANT
PACK CATARACT (MISCELLANEOUS) ×3 IMPLANT
PACK CATARACT BRASINGTON LX (MISCELLANEOUS) ×3 IMPLANT
PACK EYE AFTER SURG (MISCELLANEOUS) ×3 IMPLANT
SOL BSS BAG (MISCELLANEOUS) ×3
SOLUTION BSS BAG (MISCELLANEOUS) ×1 IMPLANT
SYR 5ML LL (SYRINGE) ×3 IMPLANT
WATER STERILE IRR 250ML POUR (IV SOLUTION) ×3 IMPLANT
WIPE NON LINTING 3.25X3.25 (MISCELLANEOUS) ×3 IMPLANT

## 2020-07-06 NOTE — Discharge Instructions (Addendum)
Eye Surgery Discharge Instructions  Expect mild scratchy sensation or mild soreness. DO NOT RUB YOUR EYE!  The day of surgery: . Minimal physical activity, but bed rest is not required . No reading, computer work, or close hand work . No bending, lifting, or straining. . May watch TV  For 24 hours: . No driving, legal decisions, or alcoholic beverages . Safety precautions . Eat anything you prefer: It is better to start with liquids, then soup then solid foods. . Solar shield eyeglasses should be worn for comfort in the sunlight/patch while sleeping  Resume all regular medications including aspirin or Coumadin if these were discontinued prior to surgery. You may shower, bathe, shave, or wash your hair. Tylenol may be taken for mild discomfort. FOLLOW DR. PORFILIO'S EYE DROP INSTRUCTION SHEET AS REVIEWED.  Call your doctor if you experience significant pain, nausea, or vomiting, fever > 101 or other signs of infection. [phone] or [phone] Specific instructions:   Follow-up Information    Porfilio, William, MD Follow up.   Specialty: Ophthalmology Why: as scheduled Contact information: [address] [place] [address] [phone]               

## 2020-07-06 NOTE — H&P (Signed)
All labs reviewed. Abnormal studies sent to patients PCP when indicated.  Previous H&P reviewed, patient examined, there are NO CHANGES.  Ardythe Klute Porfilio9/9/20217:13 AM

## 2020-07-06 NOTE — OR Nursing (Signed)
IV left hand not documented in epic.  Deaccessed in postop, gauze/paper tape applied.

## 2020-07-06 NOTE — Transfer of Care (Signed)
Immediate Anesthesia Transfer of Care Note  Patient: Judith Mitchell  Procedure(s) Performed: CATARACT EXTRACTION PHACO AND INTRAOCULAR LENS PLACEMENT (IOC) (Left Eye)  Patient Location: PACU  Anesthesia Type:MAC  Level of Consciousness: awake, alert  and oriented  Airway & Oxygen Therapy: Patient Spontanous Breathing  Post-op Assessment: Report given to RN and Post -op Vital signs reviewed and stable  Post vital signs: Reviewed and stable  Last Vitals:  Vitals Value Taken Time  BP    Temp    Pulse    Resp    SpO2      Last Pain:  Vitals:   07/06/20 0634  TempSrc: Temporal  PainSc: 0-No pain         Complications: No complications documented.

## 2020-07-06 NOTE — Op Note (Signed)
PREOPERATIVE DIAGNOSIS:  Nuclear sclerotic cataract of the left eye.   POSTOPERATIVE DIAGNOSIS:  Nuclear sclerotic cataract of the left eye.   OPERATIVE PROCEDURE: Procedure(s): CATARACT EXTRACTION PHACO AND INTRAOCULAR LENS PLACEMENT (IOC)   SURGEON:  Birder Robson, MD.   ANESTHESIA:  Anesthesiologist: Tera Mater, MD CRNA: Jerrye Noble, CRNA  1.      Managed anesthesia care. 2.     0.88ml of Shugarcaine was instilled following the paracentesis   COMPLICATIONS:  None.   TECHNIQUE:   Stop and chop   DESCRIPTION OF PROCEDURE:  The patient was examined and consented in the preoperative holding area where the aforementioned topical anesthesia was applied to the left eye and then brought back to the Operating Room where the left eye was prepped and draped in the usual sterile ophthalmic fashion and a lid speculum was placed. A paracentesis was created with the side port blade and the anterior chamber was filled with viscoelastic. A near clear corneal incision was performed with the steel keratome. A continuous curvilinear capsulorrhexis was performed with a cystotome followed by the capsulorrhexis forceps. Hydrodissection and hydrodelineation were carried out with BSS on a blunt cannula. The lens was removed in a stop and chop  technique and the remaining cortical material was removed with the irrigation-aspiration handpiece. The capsular bag was inflated with viscoelastic and the Technis DIB00 lens was placed in the capsular bag without complication. The remaining viscoelastic was removed from the eye with the irrigation-aspiration handpiece. The wounds were hydrated. The anterior chamber was flushed with Miostat and the eye was inflated to physiologic pressure. 0.62ml Vigamox was placed in the anterior chamber. The wounds were found to be water tight. The eye was dressed with Vigamox. The patient was given protective glasses to wear throughout the day and a shield with which to sleep  tonight. The patient was also given drops with which to begin a drop regimen today and will follow-up with me in one day. Implant Name Type Inv. Item Serial No. Manufacturer Lot No. LRB No. Used Action  LENS II EYHANCE 20.5 - [id number]  LENS II EYHANCE 20.5 [id number] JOHNSON   Left 1 Implanted    Procedure(s) with comments: CATARACT EXTRACTION PHACO AND INTRAOCULAR LENS PLACEMENT (IOC) (Left) - [place] 00:23.7 CDE 4.47 AP% 17.8 Fluid pack lot # [id number]  Electronically signed: Birder Robson 07/06/2020 8:06 AM

## 2020-07-06 NOTE — Anesthesia Procedure Notes (Signed)
Date/Time: 07/06/2020 7:50 AM Performed by: Jerrye Noble, CRNA Pre-anesthesia Checklist: Patient identified, Emergency Drugs available and Patient being monitored Patient Re-evaluated:Patient Re-evaluated prior to induction Oxygen Delivery Method: Nasal cannula

## 2020-07-06 NOTE — Anesthesia Preprocedure Evaluation (Addendum)
Anesthesia Evaluation  Patient identified by MRN, date of birth, ID band Patient awake    Reviewed: Allergy & Precautions, H&P , NPO status , Patient's Chart, lab work & pertinent test results  History of Anesthesia Complications Negative for: history of anesthetic complications  Airway Mallampati: II  TM Distance: >3 FB     Dental  (+) Chipped   Pulmonary neg pulmonary ROS, neg sleep apnea, neg COPD,           Cardiovascular hypertension, (-) angina(-) Past MI and (-) Cardiac Stents (-) dysrhythmias      Neuro/Psych negative neurological ROS  negative psych ROS   GI/Hepatic negative GI ROS, Neg liver ROS,   Endo/Other  diabetes  Renal/GU      Musculoskeletal  (+) Arthritis ,   Abdominal   Peds  Hematology negative hematology ROS (+)   Anesthesia Other Findings Deaf.  Sign language interpreter used for consent. Obese.  Past Medical History: No date: Arthritis No date: Cellulitis No date: Deaf No date: Diabetes mellitus without complication ([place]) No date: High cholesterol No date: Hypertension No date: Obesity  Past Surgical History: No date: ABDOMINAL HYSTERECTOMY No date: CHOLECYSTECTOMY 01/29/2016: ESOPHAGOGASTRODUODENOSCOPY (EGD) WITH PROPOFOL; N/A     Comment:  Procedure: ESOPHAGOGASTRODUODENOSCOPY (EGD) WITH               PROPOFOL;  Surgeon: Lollie Sails, MD;  Location:               [facility] ENDOSCOPY;  Service: Endoscopy;  Laterality: N/A;  BMI    Body Mass Index: 32.12 kg/m      Reproductive/Obstetrics negative OB ROS                            Anesthesia Physical Anesthesia Plan  ASA: II  Anesthesia Plan: MAC   Post-op Pain Management:    Induction:   PONV Risk Score and Plan:   Airway Management Planned: Nasal Cannula  Additional Equipment:   Intra-op Plan:   Post-operative Plan:   Informed Consent: I have reviewed the patients History and  Physical, chart, labs and discussed the procedure including the risks, benefits and alternatives for the proposed anesthesia with the patient or authorized representative who has indicated his/her understanding and acceptance.       Plan Discussed with: Anesthesiologist, CRNA and Surgeon  Anesthesia Plan Comments:         Anesthesia Quick Evaluation

## 2020-07-07 NOTE — Anesthesia Postprocedure Evaluation (Signed)
Anesthesia Post Note  Patient: Judith Mitchell  Procedure(s) Performed: CATARACT EXTRACTION PHACO AND INTRAOCULAR LENS PLACEMENT (IOC) (Left Eye)  Patient location during evaluation: PACU Anesthesia Type: MAC Level of consciousness: awake and alert Pain management: pain level controlled Vital Signs Assessment: post-procedure vital signs reviewed and stable Respiratory status: spontaneous breathing, nonlabored ventilation and respiratory function stable Cardiovascular status: stable and blood pressure returned to baseline Postop Assessment: no apparent nausea or vomiting Anesthetic complications: no   No complications documented.   Last Vitals:  Vitals:   07/06/20 0634 07/06/20 0808  BP: (!) 167/93 (!) 161/64  Pulse: 75 (!) 53  Resp: 16 16  Temp: (!) 36.1 C (!) 36.3 C  SpO2: 96% 98%    Last Pain:  Vitals:   07/07/20 1451  TempSrc:   PainSc: 0-No pain                 Brett Canales [place] Arm[place]arez

## 2020-07-27 DIAGNOSIS — H2511 Age-related nuclear cataract, right eye: Secondary | ICD-10-CM | POA: Diagnosis not present

## 2020-07-27 DIAGNOSIS — E119 Type 2 diabetes mellitus without complications: Secondary | ICD-10-CM | POA: Diagnosis not present

## 2020-08-01 ENCOUNTER — Other Ambulatory Visit: Payer: Self-pay

## 2020-08-01 ENCOUNTER — Encounter: Payer: Self-pay | Admitting: Ophthalmology

## 2020-08-02 ENCOUNTER — Encounter: Payer: Self-pay | Admitting: Anesthesiology

## 2020-08-04 ENCOUNTER — Other Ambulatory Visit: Admission: RE | Admit: 2020-08-04 | Payer: PPO | Source: Ambulatory Visit

## 2020-09-26 ENCOUNTER — Ambulatory Visit: Admission: RE | Admit: 2020-09-26 | Payer: PPO | Source: Home / Self Care | Admitting: Ophthalmology

## 2020-09-26 SURGERY — PHACOEMULSIFICATION, CATARACT, WITH IOL INSERTION
Anesthesia: Topical | Laterality: Right

## 2021-01-27 ENCOUNTER — Emergency Department
Admission: EM | Admit: 2021-01-27 | Discharge: 2021-01-28 | Disposition: A | Discharge status: Home / Self Care | Payer: PPO | Attending: Student in an Organized Health Care Education/Training Program | Admitting: Student in an Organized Health Care Education/Training Program

## 2021-01-27 ENCOUNTER — Emergency Department: Payer: PPO

## 2021-01-27 ENCOUNTER — Other Ambulatory Visit: Payer: Self-pay

## 2021-01-27 DIAGNOSIS — E119 Type 2 diabetes mellitus without complications: Secondary | ICD-10-CM | POA: Insufficient documentation

## 2021-01-27 DIAGNOSIS — Z79899 Other long term (current) drug therapy: Secondary | ICD-10-CM | POA: Diagnosis not present

## 2021-01-27 DIAGNOSIS — R112 Nausea with vomiting, unspecified: Secondary | ICD-10-CM | POA: Diagnosis not present

## 2021-01-27 DIAGNOSIS — R109 Unspecified abdominal pain: Secondary | ICD-10-CM | POA: Diagnosis not present

## 2021-01-27 DIAGNOSIS — I10 Essential (primary) hypertension: Secondary | ICD-10-CM | POA: Insufficient documentation

## 2021-01-27 DIAGNOSIS — R197 Diarrhea, unspecified: Secondary | ICD-10-CM | POA: Diagnosis not present

## 2021-01-27 DIAGNOSIS — R1110 Vomiting, unspecified: Secondary | ICD-10-CM | POA: Diagnosis not present

## 2021-01-27 DIAGNOSIS — Z7984 Long term (current) use of oral hypoglycemic drugs: Secondary | ICD-10-CM | POA: Diagnosis not present

## 2021-01-27 DIAGNOSIS — R1084 Generalized abdominal pain: Secondary | ICD-10-CM | POA: Diagnosis not present

## 2021-01-27 LAB — CBC
HCT: 43.3 % (ref 36.0–46.0)
Hemoglobin: 14.9 g/dL (ref 12.0–15.0)
MCH: 28 pg (ref 26.0–34.0)
MCHC: 34.4 g/dL (ref 30.0–36.0)
MCV: 81.2 fL (ref 80.0–100.0)
Platelets: 143 10*3/uL — ABNORMAL LOW (ref 150–400)
RBC: 5.33 MIL/uL — ABNORMAL HIGH (ref 3.87–5.11)
RDW: 13.2 % (ref 11.5–15.5)
WBC: 6.9 10*3/uL (ref 4.0–10.5)
nRBC: 0 % (ref 0.0–0.2)

## 2021-01-27 LAB — COMPREHENSIVE METABOLIC PANEL
ALT: 17 U/L (ref 0–44)
AST: 23 U/L (ref 15–41)
Albumin: 3.6 g/dL (ref 3.5–5.0)
Alkaline Phosphatase: 78 U/L (ref 38–126)
Anion gap: 9 (ref 5–15)
BUN: 23 mg/dL (ref 8–23)
CO2: 27 mmol/L (ref 22–32)
Calcium: 9 mg/dL (ref 8.9–10.3)
Chloride: 102 mmol/L (ref 98–111)
Creatinine, Ser: 0.82 mg/dL (ref 0.44–1.00)
GFR, Estimated: >60 mL/min (ref >60)
Glucose, Bld: 202 mg/dL — ABNORMAL HIGH (ref 70–99)
Potassium: 3.4 mmol/L — ABNORMAL LOW (ref 3.5–5.1)
Sodium: 138 mmol/L (ref 135–145)
Total Bilirubin: 1 mg/dL (ref 0.3–1.2)
Total Protein: 6.8 g/dL (ref 6.5–8.1)

## 2021-01-27 LAB — URINALYSIS, COMPLETE (UACMP) WITH MICROSCOPIC
Bacteria, UA: NONE SEEN
Bilirubin Urine: NEGATIVE
Glucose, UA: 50 mg/dL — AB
Ketones, ur: NEGATIVE mg/dL
Nitrite: NEGATIVE
Protein, ur: NEGATIVE mg/dL
Specific Gravity, Urine: 1.043 — ABNORMAL HIGH (ref 1.005–1.030)
pH: 5 (ref 5.0–8.0)

## 2021-01-27 LAB — LIPASE, BLOOD: Lipase: 26 U/L (ref 11–51)

## 2021-01-27 LAB — LACTIC ACID, PLASMA: Lactic Acid, Venous: 1.7 mmol/L (ref 0.5–1.9)

## 2021-01-27 MED ORDER — ONDANSETRON 4 MG PO TBDP: 4.0000 mg | ORAL_TABLET | Freq: Three times a day (TID) | ORAL | 0 refills | Status: AC | PRN

## 2021-01-27 MED ORDER — ONDANSETRON HCL 4 MG/2ML IJ SOLN
4.0000 mg | Freq: Once | INTRAMUSCULAR | Status: AC
  Administered 2021-01-27: 4 mg via INTRAVENOUS
  Filled 2021-01-27: qty 2

## 2021-01-27 MED ORDER — SUCRALFATE 1 G PO TABS
1.0000 g | ORAL_TABLET | Freq: Once | ORAL | Status: AC
  Administered 2021-01-27: 1 g via ORAL
  Filled 2021-01-27: qty 1

## 2021-01-27 MED ORDER — SODIUM CHLORIDE 0.9 % IV BOLUS
1000.0000 mL | Freq: Once | INTRAVENOUS | Status: AC
  Administered 2021-01-27: 1000 mL via INTRAVENOUS

## 2021-01-27 MED ORDER — IOHEXOL 300 MG/ML  SOLN
100.0000 mL | Freq: Once | INTRAMUSCULAR | Status: AC | PRN
  Administered 2021-01-27: 100 mL via INTRAVENOUS

## 2021-01-27 NOTE — ED Provider Notes (Signed)
[facility] Emergency Department Provider Note    Event Date/Time   First MD Initiated Contact with Patient 01/27/21 2125     (approximate)  I have reviewed the triage vital signs and the nursing notes.   HISTORY  Chief Complaint Emesis and Diarrhea    HPI Judith Mitchell is a 70 y.o. female the below listed past medical history presents to the ER for 1 day of nausea vomiting diarrhea as well as generalized abdominal pain.  Is not been able to keep anything down.  No sick contacts she lives at home alone.  No measured fevers that she is aware of but did feel some chills.  Feels very weak and dehydrated.  Denies any chest pain or shortness of breath.    Past Medical History:  Diagnosis Date  . Arthritis   . Cellulitis   . Deaf   . Diabetes mellitus without complication ([place])   . High cholesterol   . Hypertension   . Obesity    No family history on file. Past Surgical History:  Procedure Laterality Date  . ABDOMINAL HYSTERECTOMY    . CATARACT EXTRACTION W/PHACO Left 07/06/2020   Procedure: CATARACT EXTRACTION PHACO AND INTRAOCULAR LENS PLACEMENT (IOC);  Surgeon: Birder Robson, MD;  Location: [facility] ORS;  Service: Ophthalmology;  Laterality: Left;  [place] 00:23.7 CDE 4.47 AP% 17.8 Fluid pack lot # [id number]  . CHOLECYSTECTOMY    . ESOPHAGOGASTRODUODENOSCOPY (EGD) WITH PROPOFOL N/A 01/29/2016   Procedure: ESOPHAGOGASTRODUODENOSCOPY (EGD) WITH PROPOFOL;  Surgeon: Lollie Sails, MD;  Location: [facility] ENDOSCOPY;  Service: Endoscopy;  Laterality: N/A;   There are no problems to display for this patient.     Prior to Admission medications   Medication Sig Start Date End Date Taking? Authorizing Provider  ondansetron (ZOFRAN ODT) 4 MG disintegrating tablet Take 1 tablet (4 mg total) by mouth every 8 (eight) hours as needed for nausea or vomiting. 01/27/21  Yes Merlyn Lot, MD  acetaminophen (TYLENOL) 500 MG tablet Take 500-1,000 mg by mouth every 6  (six) hours as needed (for pain.).    [provider]  famotidine (PEPCID) 20 MG tablet Take 1 tablet (20 mg total) by mouth 2 (two) times daily for 10 days. Patient not taking: Reported on 08/01/2020 11/01/19 06/27/21  Arta Silence, MD  losartan-hydrochlorothiazide (HYZAAR) 100-25 MG tablet Take 1 tablet by mouth daily.    [provider]  metFORMIN (GLUCOPHAGE-XR) 500 MG 24 hr tablet Take 500 mg by mouth daily with supper.     [provider]  naproxen (NAPROSYN) 500 MG tablet Take 500 mg by mouth 2 (two) times daily as needed.     [provider]  simvastatin (ZOCOR) 40 MG tablet Take 40 mg by mouth every evening.     [provider]    Allergies Patient has no known allergies.    Social History Social History   Tobacco Use  . Smoking status: Never Smoker  . Smokeless tobacco: Never Used  Vaping Use  . Vaping Use: Never used  Substance Use Topics  . Alcohol use: No  . Drug use: No    Review of Systems Patient denies headaches, rhinorrhea, blurry vision, numbness, shortness of breath, chest pain, edema, cough, abdominal pain, nausea, vomiting, diarrhea, dysuria, fevers, rashes or hallucinations unless otherwise stated above in HPI. ____________________________________________   PHYSICAL EXAM:  VITAL SIGNS: Vitals:   01/27/21 2212 01/27/21 2312  BP: (!) 183/85 (!) 157/89  Pulse: 91 85  Resp: 17  18  Temp:    SpO2: 96% 97%    Constitutional: Alert and oriented.  Eyes: Conjunctivae are normal.  Head: Atraumatic. Nose: No congestion/rhinnorhea. Mouth/Throat: Mucous membranes are moist.   Neck: No stridor. Painless ROM.  Cardiovascular: Normal rate, regular rhythm. Grossly normal heart sounds.  Good peripheral circulation. Respiratory: Normal respiratory effort.  No retractions. Lungs CTAB. Gastrointestinal: Soft with some mild generalized ttp. No distention. No abdominal bruits. No CVA tenderness. Genitourinary:   Musculoskeletal: No lower extremity tenderness nor edema.  No joint effusions. Neurologic:  Normal speech and language. No gross focal neurologic deficits are appreciated. No facial droop Skin:  Skin is warm, dry and intact. No rash noted. Psychiatric: Mood and affect are normal. Speech and behavior are normal.  ____________________________________________   LABS (all labs ordered are listed, but only abnormal results are displayed)  Results for orders placed or performed during the hospital encounter of 01/27/21 (from the past 24 hour(s))  Urinalysis, Complete w Microscopic Urine, Clean Catch     Status: Abnormal   Collection Time: 01/27/21  9:33 PM  Result Value Ref Range   Color, Urine YELLOW (A) YELLOW   APPearance HAZY (A) CLEAR   Specific Gravity, Urine 1.043 (H) 1.005 - 1.030   pH 5.0 5.0 - 8.0   Glucose, UA 50 (A) NEGATIVE mg/dL   Hgb urine dipstick SMALL (A) NEGATIVE   Bilirubin Urine NEGATIVE NEGATIVE   Ketones, ur NEGATIVE NEGATIVE mg/dL   Protein, ur NEGATIVE NEGATIVE mg/dL   Nitrite NEGATIVE NEGATIVE   Leukocytes,Ua TRACE (A) NEGATIVE   RBC / HPF 0-5 0 - 5 RBC/hpf   WBC, UA 6-10 0 - 5 WBC/hpf   Bacteria, UA NONE SEEN NONE SEEN   Squamous Epithelial / LPF 6-10 0 - 5   Mucus PRESENT   CBC     Status: Abnormal   Collection Time: 01/27/21  9:41 PM  Result Value Ref Range   WBC 6.9 4.0 - 10.5 K/uL   RBC 5.33 (H) 3.87 - 5.11 MIL/uL   Hemoglobin 14.9 12.0 - 15.0 g/dL   HCT 43.3 36.0 - 46.0 %   MCV 81.2 80.0 - 100.0 fL   MCH 28.0 26.0 - 34.0 pg   MCHC 34.4 30.0 - 36.0 g/dL   RDW 13.2 11.5 - 15.5 %   Platelets 143 (L) 150 - 400 K/uL   nRBC 0.0 0.0 - 0.2 %  Comprehensive metabolic panel     Status: Abnormal   Collection Time: 01/27/21  9:41 PM  Result Value Ref Range   Sodium 138 135 - 145 mmol/L   Potassium 3.4 (L) 3.5 - 5.1 mmol/L   Chloride 102 98 - 111 mmol/L   CO2 27 22 - 32 mmol/L   Glucose, Bld 202 (H) 70 - 99 mg/dL   BUN 23 8 - 23 mg/dL   Creatinine,  Ser 0.82 0.44 - 1.00 mg/dL   Calcium 9.0 8.9 - 10.3 mg/dL   Total Protein 6.8 6.5 - 8.1 g/dL   Albumin 3.6 3.5 - 5.0 g/dL   AST 23 15 - 41 U/L   ALT 17 0 - 44 U/L   Alkaline Phosphatase 78 38 - 126 U/L   Total Bilirubin 1.0 0.3 - 1.2 mg/dL   GFR, Estimated >60 >60 mL/min   Anion gap 9 5 - 15  Lactic acid, plasma     Status: None   Collection Time: 01/27/21  9:41 PM  Result Value Ref Range   Lactic Acid, Venous 1.7 0.5 -  1.9 mmol/L  Lipase, blood     Status: None   Collection Time: 01/27/21  9:41 PM  Result Value Ref Range   Lipase 26 11 - 51 U/L   ____________________________________________  ___________________________________________  RADIOLOGY  I personally reviewed all radiographic images ordered to evaluate for the above acute complaints and reviewed radiology reports and findings.  These findings were personally discussed with the patient.  Please see medical record for radiology report.  ____________________________________________   PROCEDURES  Procedure(s) performed:  Procedures    Critical Care performed: no ____________________________________________   INITIAL IMPRESSION / ASSESSMENT AND PLAN / ED COURSE  Pertinent labs & imaging results that were available during my care of the patient were reviewed by me and considered in my medical decision making (see chart for details).   DDX: Enteritis, gastritis, diverticulitis, colitis, SBO, constipation, biliary pathology, pancreatitis  Judith Mitchell is a 70 y.o. who presents to the ED with presentation as described above.  Patient nontoxic-appearing but reporting nausea vomiting diarrhea illness for 1 day poor p.o. intake will give IV fluids as well as IV antiemetic.  Given her abdominal pain will order CT imaging to evaluate for the but differential.  Will reassess.  Clinical Course as of 01/27/21 2340  Sat Jan 27, 2021  2330 Reassessed.  Blood work reassuring.  Is asking for something to drink.  Urine is  hemoconcentrated but lactate normal CT imaging without evidence of acute intra-abdominal process.  Does not have any additional episodes of diarrhea.  Will make sure patient is tolerating po, if so I do believe she stable appropriate for trial of outpatient management with prescription for antiemetic and follow-up with PCP.  We discussed signs and symptoms for which she should return to the ER.  Patient agreeable to plan. [PR]    Clinical Course User Index [PR] Merlyn Lot, MD    The patient was evaluated in Emergency Department today for the symptoms described in the history of present illness. He/she was evaluated in the context of the global COVID-19 pandemic, which necessitated consideration that the patient might be at risk for infection with the SARS-CoV-2 virus that causes COVID-19. Institutional protocols and algorithms that pertain to the evaluation of patients at risk for COVID-19 are in a state of rapid change based on information released by regulatory bodies including the CDC and federal and state organizations. These policies and algorithms were followed during the patient's care in the ED.  As part of my medical decision making, I reviewed the following data within the [place] notes reviewed and incorporated, Labs reviewed, notes from prior ED visits and [place] Controlled Substance Database   ____________________________________________   FINAL CLINICAL IMPRESSION(S) / ED DIAGNOSES  Final diagnoses:  Nausea vomiting and diarrhea      NEW MEDICATIONS STARTED DURING THIS VISIT:  New Prescriptions   ONDANSETRON (ZOFRAN ODT) 4 MG DISINTEGRATING TABLET    Take 1 tablet (4 mg total) by mouth every 8 (eight) hours as needed for nausea or vomiting.     Note:  This document was prepared using Dragon voice recognition software and may include unintentional dictation errors.    Merlyn Lot, MD 01/27/21 2340

## 2021-01-27 NOTE — ED Triage Notes (Signed)
Pt reports she woke up this morning around 0400 she woke up and ran to the bathroom with vomiting and diarrhea. Pt reports these episodes multiple times today. Pt reports feeling feverish and achy and abdominal pain that feels like an ulcer. Headache also reported.

## 2021-01-30 ENCOUNTER — Other Ambulatory Visit: Payer: Self-pay | Admitting: Family Medicine

## 2021-01-30 DIAGNOSIS — Z1231 Encounter for screening mammogram for malignant neoplasm of breast: Secondary | ICD-10-CM

## 2021-02-01 DIAGNOSIS — R1013 Epigastric pain: Secondary | ICD-10-CM | POA: Diagnosis not present

## 2021-02-14 DIAGNOSIS — Z6841 Body Mass Index (BMI) 40.0 and over, adult: Secondary | ICD-10-CM | POA: Diagnosis not present

## 2021-02-14 DIAGNOSIS — E876 Hypokalemia: Secondary | ICD-10-CM | POA: Diagnosis not present

## 2021-02-14 DIAGNOSIS — I10 Essential (primary) hypertension: Secondary | ICD-10-CM | POA: Diagnosis not present

## 2021-02-14 DIAGNOSIS — R1013 Epigastric pain: Secondary | ICD-10-CM | POA: Diagnosis not present

## 2021-02-20 ENCOUNTER — Ambulatory Visit
Admission: RE | Admit: 2021-02-20 | Discharge: 2021-02-20 | Disposition: A | Discharge status: Home / Self Care | Payer: PPO | Source: Ambulatory Visit | Attending: Family Medicine | Admitting: Family Medicine

## 2021-02-20 ENCOUNTER — Other Ambulatory Visit: Payer: Self-pay

## 2021-02-20 DIAGNOSIS — Z1231 Encounter for screening mammogram for malignant neoplasm of breast: Secondary | ICD-10-CM | POA: Diagnosis not present

## 2021-02-22 DIAGNOSIS — R1013 Epigastric pain: Secondary | ICD-10-CM | POA: Diagnosis not present

## 2021-02-22 DIAGNOSIS — Z86010 Personal history of colonic polyps: Secondary | ICD-10-CM | POA: Diagnosis not present

## 2021-02-26 ENCOUNTER — Other Ambulatory Visit: Payer: Self-pay | Admitting: Family Medicine

## 2021-02-26 DIAGNOSIS — R928 Other abnormal and inconclusive findings on diagnostic imaging of breast: Secondary | ICD-10-CM

## 2021-02-26 DIAGNOSIS — N6489 Other specified disorders of breast: Secondary | ICD-10-CM

## 2021-03-01 ENCOUNTER — Other Ambulatory Visit: Payer: PPO

## 2021-03-01 ENCOUNTER — Ambulatory Visit: Payer: PPO | Attending: Family Medicine

## 2021-03-28 DIAGNOSIS — I10 Essential (primary) hypertension: Secondary | ICD-10-CM | POA: Diagnosis not present

## 2021-03-28 DIAGNOSIS — I878 Other specified disorders of veins: Secondary | ICD-10-CM | POA: Diagnosis not present

## 2021-03-28 DIAGNOSIS — F4321 Adjustment disorder with depressed mood: Secondary | ICD-10-CM | POA: Diagnosis not present

## 2021-03-28 DIAGNOSIS — E119 Type 2 diabetes mellitus without complications: Secondary | ICD-10-CM | POA: Diagnosis not present

## 2021-03-28 DIAGNOSIS — Z6841 Body Mass Index (BMI) 40.0 and over, adult: Secondary | ICD-10-CM | POA: Diagnosis not present

## 2021-03-28 DIAGNOSIS — F419 Anxiety disorder, unspecified: Secondary | ICD-10-CM | POA: Diagnosis not present

## 2021-03-30 DIAGNOSIS — H2511 Age-related nuclear cataract, right eye: Secondary | ICD-10-CM | POA: Diagnosis not present

## 2021-04-19 ENCOUNTER — Encounter: Payer: Self-pay | Admitting: Ophthalmology

## 2021-04-19 ENCOUNTER — Other Ambulatory Visit: Payer: Self-pay

## 2021-04-19 DIAGNOSIS — H25011 Cortical age-related cataract, right eye: Secondary | ICD-10-CM | POA: Diagnosis not present

## 2021-05-01 ENCOUNTER — Encounter
Admission: RE | Disposition: A | Discharge status: Home / Self Care | Payer: Self-pay | Source: Home / Self Care | Attending: Ophthalmology

## 2021-05-01 ENCOUNTER — Other Ambulatory Visit: Payer: Self-pay

## 2021-05-01 ENCOUNTER — Ambulatory Visit
Admission: RE | Admit: 2021-05-01 | Discharge: 2021-05-01 | Disposition: A | Discharge status: Home / Self Care | Payer: PPO | Attending: Ophthalmology | Admitting: Ophthalmology

## 2021-05-01 ENCOUNTER — Ambulatory Visit: Payer: PPO | Admitting: Anesthesiology

## 2021-05-01 ENCOUNTER — Encounter: Payer: Self-pay | Admitting: Ophthalmology

## 2021-05-01 DIAGNOSIS — E1136 Type 2 diabetes mellitus with diabetic cataract: Secondary | ICD-10-CM | POA: Diagnosis not present

## 2021-05-01 DIAGNOSIS — H25811 Combined forms of age-related cataract, right eye: Secondary | ICD-10-CM | POA: Diagnosis not present

## 2021-05-01 DIAGNOSIS — H2511 Age-related nuclear cataract, right eye: Secondary | ICD-10-CM | POA: Diagnosis not present

## 2021-05-01 DIAGNOSIS — Z79899 Other long term (current) drug therapy: Secondary | ICD-10-CM | POA: Insufficient documentation

## 2021-05-01 DIAGNOSIS — H25011 Cortical age-related cataract, right eye: Secondary | ICD-10-CM | POA: Diagnosis not present

## 2021-05-01 DIAGNOSIS — Z7984 Long term (current) use of oral hypoglycemic drugs: Secondary | ICD-10-CM | POA: Insufficient documentation

## 2021-05-01 HISTORY — PX: CATARACT EXTRACTION W/PHACO: SHX586

## 2021-05-01 LAB — GLUCOSE, CAPILLARY: Glucose-Capillary: 136 mg/dL — ABNORMAL HIGH (ref 70–99)

## 2021-05-01 SURGERY — PHACOEMULSIFICATION, CATARACT, WITH IOL INSERTION
Anesthesia: Monitor Anesthesia Care | Site: Eye | Laterality: Right

## 2021-05-01 MED ORDER — FENTANYL CITRATE (PF) 100 MCG/2ML IJ SOLN
INTRAMUSCULAR | Status: DC | PRN
  Administered 2021-05-01: 50 ug via INTRAVENOUS

## 2021-05-01 MED ORDER — SIGHTPATH DOSE#1 BSS IO SOLN
INTRAOCULAR | Status: DC | PRN
  Administered 2021-05-01: 40 mL via OPHTHALMIC

## 2021-05-01 MED ORDER — MIDAZOLAM HCL 2 MG/2ML IJ SOLN
INTRAMUSCULAR | Status: DC | PRN
  Administered 2021-05-01 (×2): 1 mg via INTRAVENOUS

## 2021-05-01 MED ORDER — PHENYLEPHRINE HCL 10 % OP SOLN
1.0000 [drp] | OPHTHALMIC | Status: DC | PRN
  Administered 2021-05-01 (×3): 1 [drp] via OPHTHALMIC

## 2021-05-01 MED ORDER — BRIMONIDINE TARTRATE-TIMOLOL 0.2-0.5 % OP SOLN
OPHTHALMIC | Status: DC | PRN
  Administered 2021-05-01: 1 [drp] via OPHTHALMIC

## 2021-05-01 MED ORDER — CYCLOPENTOLATE HCL 2 % OP SOLN
1.0000 [drp] | OPHTHALMIC | Status: DC | PRN
  Administered 2021-05-01 (×3): 1 [drp] via OPHTHALMIC

## 2021-05-01 MED ORDER — LACTATED RINGERS IV SOLN: INTRAVENOUS | Status: DC

## 2021-05-01 MED ORDER — TETRACAINE HCL 0.5 % OP SOLN
1.0000 [drp] | OPHTHALMIC | Status: DC | PRN
  Administered 2021-05-01 (×3): 1 [drp] via OPHTHALMIC

## 2021-05-01 MED ORDER — LABETALOL HCL 5 MG/ML IV SOLN
INTRAVENOUS | Status: DC | PRN
  Administered 2021-05-01: 5 mg via INTRAVENOUS

## 2021-05-01 MED ORDER — MOXIFLOXACIN HCL 0.5 % OP SOLN
OPHTHALMIC | Status: DC | PRN
  Administered 2021-05-01: 0.2 mL via OPHTHALMIC

## 2021-05-01 MED ORDER — SIGHTPATH DOSE#1 NA CHONDROIT SULF-NA HYALURON 40-17 MG/ML IO SOLN
INTRAOCULAR | Status: DC | PRN
  Administered 2021-05-01: 1 mL via INTRAOCULAR

## 2021-05-01 MED ORDER — LIDOCAINE HCL (PF) 2 % IJ SOLN
INTRAOCULAR | Status: DC | PRN
  Administered 2021-05-01: 1 mL

## 2021-05-01 SURGICAL SUPPLY — 15 items
CANNULA ANT/CHMB 27GA (MISCELLANEOUS) ×4 IMPLANT
GLOVE SURG ENC TEXT LTX SZ8 (GLOVE) ×2 IMPLANT
GLOVE SURG TRIUMPH 8.0 PF LTX (GLOVE) ×2 IMPLANT
GOWN STRL REUS W/ TWL LRG LVL3 (GOWN DISPOSABLE) ×2 IMPLANT
GOWN STRL REUS W/TWL LRG LVL3 (GOWN DISPOSABLE) ×4
LENS IOL TECNIS EYHANCE 21.0 (Intraocular Lens) ×2 IMPLANT
MARKER SKIN DUAL TIP RULER LAB (MISCELLANEOUS) ×2 IMPLANT
NEEDLE FILTER BLUNT 18X 1/2SAF (NEEDLE) ×1
NEEDLE FILTER BLUNT 18X1 1/2 (NEEDLE) ×1 IMPLANT
PACK EYE AFTER SURG (MISCELLANEOUS) ×2 IMPLANT
SYR 3ML LL SCALE MARK (SYRINGE) ×2 IMPLANT
SYR TB 1ML LUER SLIP (SYRINGE) ×2 IMPLANT
TIP ITREPID SGL USE BENT I/A (SUCTIONS) ×2 IMPLANT
WATER STERILE IRR 250ML POUR (IV SOLUTION) ×2 IMPLANT
WIPE NON LINTING 3.25X3.25 (MISCELLANEOUS) ×2 IMPLANT

## 2021-05-01 NOTE — Transfer of Care (Signed)
Immediate Anesthesia Transfer of Care Note  Patient: Judith Mitchell  Procedure(s) Performed: CATARACT EXTRACTION PHACO AND INTRAOCULAR LENS PLACEMENT (IOC) RIGHT DIABETIC 4.08 00:27.6 (Right: Eye)  Patient Location: PACU  Anesthesia Type: MAC  Level of Consciousness: awake, alert  and patient cooperative  Airway and Oxygen Therapy: Patient Spontanous Breathing and Patient connected to supplemental oxygen  Post-op Assessment: Post-op Vital signs reviewed, Patient's Cardiovascular Status Stable, Respiratory Function Stable, Patent Airway and No signs of Nausea or vomiting  Post-op Vital Signs: Reviewed and stable  Complications: No notable events documented.

## 2021-05-01 NOTE — H&P (Signed)
**Note Judith-Identified via Obfuscation** [facility]   Primary Care Physician:  [place] Pink, MD Ophthalmologist: Dr. George Ina  Pre-Procedure History & Physical: HPI:  Judith Mitchell is a 70 y.o. female here for cataract surgery.   Past Medical History:  Diagnosis Date   Arthritis    Cellulitis    Deaf    Diabetes mellitus without complication ([place])    High cholesterol    Hypertension    Obesity     Past Surgical History:  Procedure Laterality Date   ABDOMINAL HYSTERECTOMY     CATARACT EXTRACTION W/PHACO Left 07/06/2020   Procedure: CATARACT EXTRACTION PHACO AND INTRAOCULAR LENS PLACEMENT ([place]);  Surgeon: Birder Robson, MD;  Location: [facility] ORS;  Service: Ophthalmology;  Laterality: Left;  [place] 00:23.7 CDE 4.47 AP% 17.8 Fluid pack lot # [id number]   CHOLECYSTECTOMY     ESOPHAGOGASTRODUODENOSCOPY (EGD) WITH PROPOFOL N/A 01/29/2016   Procedure: ESOPHAGOGASTRODUODENOSCOPY (EGD) WITH PROPOFOL;  Surgeon: Lollie Sails, MD;  Location: [facility] ENDOSCOPY;  Service: Endoscopy;  Laterality: N/A;    Prior to Admission medications   Medication Sig Start Date End Date Taking? Authorizing Provider  acetaminophen (TYLENOL) 500 MG tablet Take 500-1,000 mg by mouth every 6 (six) hours as needed (for pain.).   Yes [provider]  losartan-hydrochlorothiazide (HYZAAR) 100-25 MG tablet Take 1 tablet by mouth daily.   Yes [provider]  metFORMIN (GLUCOPHAGE-XR) 500 MG 24 hr tablet Take 500 mg by mouth daily with supper.    Yes [provider]  naproxen (NAPROSYN) 500 MG tablet Take 500 mg by mouth 2 (two) times daily as needed.    Yes [provider]  ondansetron (ZOFRAN ODT) 4 MG disintegrating tablet Take 1 tablet (4 mg total) by mouth every 8 (eight) hours as needed for nausea or vomiting. 01/27/21  Yes Merlyn Lot, MD  simvastatin (ZOCOR) 40 MG tablet Take 40 mg by mouth every evening.    Yes [provider]  famotidine (PEPCID) 20 MG tablet Take 1 tablet (20 mg total) by  mouth 2 (two) times daily for 10 days. Patient not taking: Reported on 08/01/2020 11/01/19 06/27/21  Arta Silence, MD    Allergies as of 04/04/2021   (No Known Allergies)    History reviewed. No pertinent family history.  Social History   Socioeconomic History   Marital status: Married    Spouse name: Not on file   Number of children: Not on file   Years of education: Not on file   Highest education level: Not on file  Occupational History   Not on file  Tobacco Use   Smoking status: Never   Smokeless tobacco: Never  Vaping Use   Vaping Use: Never used  Substance and Sexual Activity   Alcohol use: No   Drug use: No   Sexual activity: Not on file  Other Topics Concern   Not on file  Social History Narrative   Not on file   Social Determinants of Health   Financial Resource Strain: Not on file  Food Insecurity: Not on file  Transportation Needs: Not on file  Physical Activity: Not on file  Stress: Not on file  Social Connections: Not on file  Intimate Partner Violence: Not on file    Review of Systems: See HPI, otherwise negative ROS  Physical Exam: BP (!) 214/92   Pulse 81   Temp 97.7 F (36.5 C)   Ht 5\' 5"  (1.651 m)   Wt 132.5 kg   SpO2 97%   BMI 48.59 kg/m  General:   Alert,  pleasant and cooperative in NAD Head:  Normocephalic and atraumatic. Respiratory:  Normal work of breathing. Cardiovascular:  RRR  Impression/Plan: Judith Mitchell is here for cataract surgery.  Risks, benefits, limitations, and alternatives regarding cataract surgery have been reviewed with the patient.  Questions have been answered.  All parties agreeable.   Birder Robson, MD  05/01/2021, 9:05 AM

## 2021-05-01 NOTE — Anesthesia Postprocedure Evaluation (Signed)
Anesthesia Post Note  Patient: Judith Mitchell  Procedure(s) Performed: CATARACT EXTRACTION PHACO AND INTRAOCULAR LENS PLACEMENT (IOC) RIGHT DIABETIC 4.08 00:27.6 (Right: Eye)     Patient location during evaluation: PACU Anesthesia Type: MAC Level of consciousness: awake and alert Pain management: pain level controlled Vital Signs Assessment: post-procedure vital signs reviewed and stable Respiratory status: spontaneous breathing, nonlabored ventilation and respiratory function stable Cardiovascular status: stable and blood pressure returned to baseline Postop Assessment: no apparent nausea or vomiting Anesthetic complications: no   No notable events documented.  [name]

## 2021-05-01 NOTE — Anesthesia Preprocedure Evaluation (Signed)
Anesthesia Evaluation    Airway Mallampati: II  TM Distance: >3 FB Neck ROM: Full    Dental no notable dental hx.    Pulmonary    Pulmonary exam normal breath sounds clear to auscultation       Cardiovascular hypertension, Normal cardiovascular exam Rhythm:Regular Rate:Normal     Neuro/Psych    GI/Hepatic   Endo/Other  diabetes  Renal/GU      Musculoskeletal  (+) Arthritis ,   Abdominal   Peds  Hematology   Anesthesia Other Findings   Reproductive/Obstetrics                             Anesthesia Physical Anesthesia Plan  ASA: 2  Anesthesia Plan: MAC   Post-op Pain Management:    Induction: Intravenous  PONV Risk Score and Plan: 2 and Midazolam, Treatment may vary due to age or medical condition and TIVA  Airway Management Planned: Nasal Cannula  Additional Equipment:   Intra-op Plan:   Post-operative Plan:   Informed Consent: I have reviewed the patients History and Physical, chart, labs and discussed the procedure including the risks, benefits and alternatives for the proposed anesthesia with the patient or authorized representative who has indicated his/her understanding and acceptance.       Plan Discussed with: CRNA  Anesthesia Plan Comments: (ASL interpreter)        Anesthesia Quick Evaluation

## 2021-05-01 NOTE — Op Note (Signed)
PREOPERATIVE DIAGNOSIS:  Nuclear sclerotic cataract of the right eye.   POSTOPERATIVE DIAGNOSIS:  H25.011 Cataract   OPERATIVE PROCEDURE:ORPROCALL@   SURGEON:  Birder Robson, MD.   ANESTHESIA:  Anesthesiologist: [name], MD CRNA: Cameron Ali, CRNA  1.      Managed anesthesia care. 2.      0.38ml of Shugarcaine was instilled in the eye following the paracentesis.   COMPLICATIONS:  None.   TECHNIQUE:   Stop and chop   DESCRIPTION OF PROCEDURE:  The patient was examined and consented in the preoperative holding area where the aforementioned topical anesthesia was applied to the right eye and then brought back to the Operating Room where the right eye was prepped and draped in the usual sterile ophthalmic fashion and a lid speculum was placed. A paracentesis was created with the side port blade and the anterior chamber was filled with viscoelastic. A near clear corneal incision was performed with the steel keratome. A continuous curvilinear capsulorrhexis was performed with a cystotome followed by the capsulorrhexis forceps. Hydrodissection and hydrodelineation were carried out with BSS on a blunt cannula. The lens was removed in a stop and chop  technique and the remaining cortical material was removed with the irrigation-aspiration handpiece. The capsular bag was inflated with viscoelastic and the Technis [id number]  lens was placed in the capsular bag without complication. The remaining viscoelastic was removed from the eye with the irrigation-aspiration handpiece. The wounds were hydrated. The anterior chamber was flushed with BSS and the eye was inflated to physiologic pressure. 0.32ml of Vigamox was placed in the anterior chamber. The wounds were found to be water tight. The eye was dressed with Combigan. The patient was given protective glasses to wear throughout the day and a shield with which to sleep tonight. The patient was also given drops with which to begin a drop regimen today  and will follow-up with me in one day. Implant Name Type Inv. Item Serial No. Manufacturer Lot No. LRB No. Used Action  LENS IOL TECNIS EYHANCE 21.0 - [id number] Intraocular Lens LENS IOL TECNIS EYHANCE 21.0 [id number] JOHNSON   Right 1 Implanted   Procedure(s): CATARACT EXTRACTION PHACO AND INTRAOCULAR LENS PLACEMENT (IOC) RIGHT DIABETIC 4.08 00:27.6 (Right)  Electronically signed: Birder Robson 05/01/2021 9:49 AM

## 2021-05-02 ENCOUNTER — Encounter: Payer: Self-pay | Admitting: Ophthalmology

## 2021-05-08 DIAGNOSIS — H9193 Unspecified hearing loss, bilateral: Secondary | ICD-10-CM | POA: Diagnosis not present

## 2021-05-08 DIAGNOSIS — F4321 Adjustment disorder with depressed mood: Secondary | ICD-10-CM | POA: Diagnosis not present

## 2021-05-08 DIAGNOSIS — E785 Hyperlipidemia, unspecified: Secondary | ICD-10-CM | POA: Diagnosis not present

## 2021-05-08 DIAGNOSIS — I10 Essential (primary) hypertension: Secondary | ICD-10-CM | POA: Diagnosis not present

## 2021-05-08 DIAGNOSIS — Z6841 Body Mass Index (BMI) 40.0 and over, adult: Secondary | ICD-10-CM | POA: Diagnosis not present

## 2021-05-08 DIAGNOSIS — E1165 Type 2 diabetes mellitus with hyperglycemia: Secondary | ICD-10-CM | POA: Diagnosis not present

## 2021-05-10 ENCOUNTER — Encounter: Payer: Self-pay | Admitting: Ophthalmology

## 2021-05-15 DIAGNOSIS — I83029 Varicose veins of left lower extremity with ulcer of unspecified site: Secondary | ICD-10-CM | POA: Diagnosis not present

## 2021-05-15 DIAGNOSIS — L97929 Non-pressure chronic ulcer of unspecified part of left lower leg with unspecified severity: Secondary | ICD-10-CM | POA: Diagnosis not present

## 2021-05-15 DIAGNOSIS — I83019 Varicose veins of right lower extremity with ulcer of unspecified site: Secondary | ICD-10-CM | POA: Diagnosis not present

## 2021-05-15 DIAGNOSIS — L97919 Non-pressure chronic ulcer of unspecified part of right lower leg with unspecified severity: Secondary | ICD-10-CM | POA: Diagnosis not present

## 2021-05-22 DIAGNOSIS — Z23 Encounter for immunization: Secondary | ICD-10-CM | POA: Diagnosis not present

## 2021-05-28 ENCOUNTER — Other Ambulatory Visit: Payer: PPO

## 2021-05-31 ENCOUNTER — Other Ambulatory Visit: Payer: PPO

## 2021-05-31 ENCOUNTER — Inpatient Hospital Stay: Admission: RE | Admit: 2021-05-31 | Payer: PPO | Source: Ambulatory Visit

## 2021-06-01 ENCOUNTER — Ambulatory Visit
Admission: RE | Admit: 2021-06-01 | Discharge: 2021-06-01 | Disposition: A | Discharge status: Home / Self Care | Payer: PPO | Attending: Gastroenterology | Admitting: Gastroenterology

## 2021-06-01 ENCOUNTER — Ambulatory Visit: Payer: PPO | Admitting: Anesthesiology

## 2021-06-01 ENCOUNTER — Encounter
Admission: RE | Disposition: A | Discharge status: Home / Self Care | Payer: Self-pay | Source: Home / Self Care | Attending: Gastroenterology

## 2021-06-01 DIAGNOSIS — E7800 Pure hypercholesterolemia, unspecified: Secondary | ICD-10-CM | POA: Diagnosis not present

## 2021-06-01 DIAGNOSIS — K2950 Unspecified chronic gastritis without bleeding: Secondary | ICD-10-CM | POA: Insufficient documentation

## 2021-06-01 DIAGNOSIS — E119 Type 2 diabetes mellitus without complications: Secondary | ICD-10-CM | POA: Insufficient documentation

## 2021-06-01 DIAGNOSIS — Z7984 Long term (current) use of oral hypoglycemic drugs: Secondary | ICD-10-CM | POA: Insufficient documentation

## 2021-06-01 DIAGNOSIS — K5730 Diverticulosis of large intestine without perforation or abscess without bleeding: Secondary | ICD-10-CM | POA: Diagnosis not present

## 2021-06-01 DIAGNOSIS — I10 Essential (primary) hypertension: Secondary | ICD-10-CM | POA: Insufficient documentation

## 2021-06-01 DIAGNOSIS — Z1211 Encounter for screening for malignant neoplasm of colon: Secondary | ICD-10-CM | POA: Diagnosis not present

## 2021-06-01 DIAGNOSIS — Z86010 Personal history of colonic polyps: Secondary | ICD-10-CM | POA: Diagnosis not present

## 2021-06-01 DIAGNOSIS — Z79899 Other long term (current) drug therapy: Secondary | ICD-10-CM | POA: Diagnosis not present

## 2021-06-01 DIAGNOSIS — H9190 Unspecified hearing loss, unspecified ear: Secondary | ICD-10-CM | POA: Diagnosis not present

## 2021-06-01 DIAGNOSIS — R1013 Epigastric pain: Secondary | ICD-10-CM | POA: Insufficient documentation

## 2021-06-01 DIAGNOSIS — D124 Benign neoplasm of descending colon: Secondary | ICD-10-CM | POA: Insufficient documentation

## 2021-06-01 DIAGNOSIS — K449 Diaphragmatic hernia without obstruction or gangrene: Secondary | ICD-10-CM | POA: Insufficient documentation

## 2021-06-01 DIAGNOSIS — Z6841 Body Mass Index (BMI) 40.0 and over, adult: Secondary | ICD-10-CM | POA: Diagnosis not present

## 2021-06-01 DIAGNOSIS — K635 Polyp of colon: Secondary | ICD-10-CM | POA: Diagnosis not present

## 2021-06-01 DIAGNOSIS — D120 Benign neoplasm of cecum: Secondary | ICD-10-CM | POA: Diagnosis not present

## 2021-06-01 DIAGNOSIS — M1990 Unspecified osteoarthritis, unspecified site: Secondary | ICD-10-CM | POA: Diagnosis not present

## 2021-06-01 HISTORY — PX: COLONOSCOPY WITH PROPOFOL: SHX5780

## 2021-06-01 HISTORY — PX: ESOPHAGOGASTRODUODENOSCOPY (EGD) WITH PROPOFOL: SHX5813

## 2021-06-01 LAB — GLUCOSE, CAPILLARY: Glucose-Capillary: 125 mg/dL — ABNORMAL HIGH (ref 70–99)

## 2021-06-01 SURGERY — ESOPHAGOGASTRODUODENOSCOPY (EGD) WITH PROPOFOL
Anesthesia: General

## 2021-06-01 MED ORDER — PROPOFOL 10 MG/ML IV BOLUS
INTRAVENOUS | Status: DC | PRN
  Administered 2021-06-01: 100 mg via INTRAVENOUS

## 2021-06-01 MED ORDER — LIDOCAINE HCL (PF) 2 % IJ SOLN
INTRAMUSCULAR | Status: AC
  Filled 2021-06-01: qty 5

## 2021-06-01 MED ORDER — LIDOCAINE HCL (CARDIAC) PF 100 MG/5ML IV SOSY
PREFILLED_SYRINGE | INTRAVENOUS | Status: DC | PRN
  Administered 2021-06-01: 30 mg via INTRAVENOUS

## 2021-06-01 MED ORDER — PROPOFOL 500 MG/50ML IV EMUL
INTRAVENOUS | Status: AC
  Filled 2021-06-01: qty 50

## 2021-06-01 MED ORDER — SODIUM CHLORIDE 0.9 % IV SOLN
INTRAVENOUS | Status: DC
  Administered 2021-06-01: 20 mL/h via INTRAVENOUS

## 2021-06-01 MED ORDER — PROPOFOL 500 MG/50ML IV EMUL
INTRAVENOUS | Status: DC | PRN
  Administered 2021-06-01: 150 ug/kg/min via INTRAVENOUS

## 2021-06-01 NOTE — Anesthesia Preprocedure Evaluation (Signed)
Anesthesia Evaluation  Patient identified by MRN, date of birth, ID band Patient awake    Reviewed: Allergy & Precautions, NPO status , Patient's Chart, lab work & pertinent test results  History of Anesthesia Complications Negative for: history of anesthetic complications  Airway Mallampati: II  TM Distance: >3 FB Neck ROM: Full    Dental no notable dental hx.    Pulmonary neg pulmonary ROS, neg sleep apnea, neg COPD, Patient abstained from smoking.Not current smoker,    Pulmonary exam normal breath sounds clear to auscultation       Cardiovascular Exercise Tolerance: Poor METShypertension, (-) CAD and (-) Past MI Normal cardiovascular exam(-) dysrhythmias  Rhythm:Regular Rate:Normal     Neuro/Psych negative neurological ROS  negative psych ROS   GI/Hepatic GERD  ,(+)     (-) substance abuse  ,   Endo/Other  diabetesMorbid obesity  Renal/GU negative Renal ROS     Musculoskeletal  (+) Arthritis ,   Abdominal   Peds  Hematology   Anesthesia Other Findings Past Medical History: No date: Arthritis No date: Cellulitis No date: Deaf No date: Diabetes mellitus without complication ([facility]) No date: High cholesterol No date: Hypertension No date: Obesity  Reproductive/Obstetrics                             Anesthesia Physical  Anesthesia Plan  ASA: 3  Anesthesia Plan: General   Post-op Pain Management:    Induction: Intravenous  PONV Risk Score and Plan: 3 and TIVA, Propofol infusion and Ondansetron  Airway Management Planned: Nasal Cannula and Natural Airway  Additional Equipment: None  Intra-op Plan:   Post-operative Plan:   Informed Consent: I have reviewed the patients History and Physical, chart, labs and discussed the procedure including the risks, benefits and alternatives for the proposed anesthesia with the patient or authorized representative who has indicated  his/her understanding and acceptance.     Dental advisory given and Interpreter used for interveiw (in-person ASL interpreter used)  Plan Discussed with: CRNA  Anesthesia Plan Comments: (Discussed risks of anesthesia with patient, including possibility of difficulty with spontaneous ventilation under anesthesia necessitating airway intervention, PONV, and rare risks such as cardiac or respiratory or neurological events, and allergic reactions. Patient understands.)        Anesthesia Quick Evaluation

## 2021-06-01 NOTE — Anesthesia Postprocedure Evaluation (Signed)
Anesthesia Post Note  Patient: OLUWATONI IODICE  Procedure(s) Performed: ESOPHAGOGASTRODUODENOSCOPY (EGD) WITH PROPOFOL COLONOSCOPY WITH PROPOFOL  Patient location during evaluation: Endoscopy Anesthesia Type: General Level of consciousness: awake and alert Pain management: pain level controlled Vital Signs Assessment: post-procedure vital signs reviewed and stable Respiratory status: spontaneous breathing, nonlabored ventilation, respiratory function stable and patient connected to nasal cannula oxygen Cardiovascular status: blood pressure returned to baseline and stable Postop Assessment: no apparent nausea or vomiting Anesthetic complications: no   No notable events documented.   Last Vitals:  Vitals:   06/01/21 1143 06/01/21 1148  BP: (!) 138/102 (!) 178/79  Pulse: 63 (!) 58  Resp: 20 20  Temp:    SpO2: 100% 100%    Last Pain:  Vitals:   06/01/21 1148  TempSrc:   PainSc: 0-No pain                 Arita Miss

## 2021-06-01 NOTE — Op Note (Signed)
[facility] Gastroenterology Patient Name: Judith Mitchell Procedure Date: 06/01/2021 10:30 AM MRN: [id number] Account #: [id number] Date of Birth: 05-04-1951 Admit Type: Outpatient Age: 70 Room: [facility] ENDO ROOM 3 Gender: Female Note Status: Finalized Procedure:             Upper GI endoscopy Indications:           Epigastric abdominal pain Providers:             Andrey Farmer MD, MD Medicines:             Monitored Anesthesia Care Complications:         No immediate complications. Estimated blood loss:                         Minimal. Procedure:             Pre-Anesthesia Assessment:                        - Prior to the procedure, a History and Physical was                         performed, and patient medications and allergies were                         reviewed. The patient is competent. The risks and                         benefits of the procedure and the sedation options and                         risks were discussed with the patient. All questions                         were answered and informed consent was obtained.                         Patient identification and proposed procedure were                         verified by the physician, the nurse, the anesthetist                         and the technician in the endoscopy suite. Mental                         Status Examination: alert and oriented. Airway                         Examination: normal oropharyngeal airway and neck                         mobility. Respiratory Examination: clear to                         auscultation. CV Examination: normal. Prophylactic                         Antibiotics: The patient does not require prophylactic  antibiotics. Prior Anticoagulants: The patient has                         taken no previous anticoagulant or antiplatelet                         agents. ASA Grade Assessment: III - A patient with                         severe  systemic disease. After reviewing the risks and                         benefits, the patient was deemed in satisfactory                         condition to undergo the procedure. The anesthesia                         plan was to use monitored anesthesia care (MAC).                         Immediately prior to administration of medications,                         the patient was re-assessed for adequacy to receive                         sedatives. The heart rate, respiratory rate, oxygen                         saturations, blood pressure, adequacy of pulmonary                         ventilation, and response to care were monitored                         throughout the procedure. The physical status of the                         patient was re-assessed after the procedure.                        After obtaining informed consent, the endoscope was                         passed under direct vision. Throughout the procedure,                         the patient's blood pressure, pulse, and oxygen                         saturations were monitored continuously. The Endoscope                         was introduced through the mouth, and advanced to the                         second part of duodenum. The upper GI endoscopy was  accomplished without difficulty. The patient tolerated                         the procedure well. Findings:      A small hiatal hernia was present.      The exam of the esophagus was otherwise normal.      The entire examined stomach was normal. Biopsies were taken with a cold       forceps for Helicobacter pylori testing. Estimated blood loss was       minimal.      The examined duodenum was normal. Impression:            - Small hiatal hernia.                        - Normal stomach. Biopsied.                        - Normal examined duodenum. Recommendation:        - Discharge patient to home.                        - Resume previous  diet.                        - Continue present medications.                        - Await pathology results.                        - Return to referring physician as previously                         scheduled. Procedure Code(s):     --- Professional ---                        [phone], Esophagogastroduodenoscopy, flexible,                         transoral; with biopsy, single or multiple Diagnosis Code(s):     --- Professional ---                        K44.9, Diaphragmatic hernia without obstruction or                         gangrene                        R10.13, Epigastric pain CPT copyright 2019 American [employer] Association. All rights reserved. The codes documented in this report are preliminary and upon coder review may  be revised to meet current compliance requirements. Andrey Farmer MD, MD 06/01/2021 11:29:33 AM Number of Addenda: 0 Note Initiated On: 06/01/2021 10:30 AM Estimated Blood Loss:  Estimated blood loss was minimal.      [facility]

## 2021-06-01 NOTE — OR Nursing (Signed)
Richard Candis Shine - Sign Language Interpreter.

## 2021-06-01 NOTE — Op Note (Signed)
[facility] Gastroenterology Patient Name: Judith Mitchell Procedure Date: 06/01/2021 10:29 AM MRN: [id number] Account #: [id number] Date of Birth: 1950/12/17 Admit Type: Outpatient Age: 70 Room: [facility] ENDO ROOM 3 Gender: Female Note Status: Finalized Procedure:             Colonoscopy Indications:           Surveillance: Personal history of adenomatous polyps                         on last colonoscopy > 5 years ago Providers:             Andrey Farmer MD, MD Medicines:             Monitored Anesthesia Care Complications:         No immediate complications. Estimated blood loss:                         Minimal. Procedure:             Pre-Anesthesia Assessment:                        - Prior to the procedure, a History and Physical was                         performed, and patient medications and allergies were                         reviewed. The patient is competent. The risks and                         benefits of the procedure and the sedation options and                         risks were discussed with the patient. All questions                         were answered and informed consent was obtained.                         Patient identification and proposed procedure were                         verified by the physician, the nurse, the anesthetist                         and the technician in the endoscopy suite. Mental                         Status Examination: alert and oriented. Airway                         Examination: normal oropharyngeal airway and neck                         mobility. Respiratory Examination: clear to                         auscultation. CV Examination: normal. Prophylactic  Antibiotics: The patient does not require prophylactic                         antibiotics. Prior Anticoagulants: The patient has                         taken no previous anticoagulant or antiplatelet                          agents. ASA Grade Assessment: III - A patient with                         severe systemic disease. After reviewing the risks and                         benefits, the patient was deemed in satisfactory                         condition to undergo the procedure. The anesthesia                         plan was to use monitored anesthesia care (MAC).                         Immediately prior to administration of medications,                         the patient was re-assessed for adequacy to receive                         sedatives. The heart rate, respiratory rate, oxygen                         saturations, blood pressure, adequacy of pulmonary                         ventilation, and response to care were monitored                         throughout the procedure. The physical status of the                         patient was re-assessed after the procedure.                        After obtaining informed consent, the colonoscope was                         passed under direct vision. Throughout the procedure,                         the patient's blood pressure, pulse, and oxygen                         saturations were monitored continuously. The                         Colonoscope was introduced through the anus and  advanced to the the cecum, identified by appendiceal                         orifice and ileocecal valve. The colonoscopy was                         performed without difficulty. The patient tolerated                         the procedure well. The quality of the bowel                         preparation was adequate to identify polyps. Findings:      The perianal and digital rectal examinations were normal.      A 3 mm polyp was found in the cecum. The polyp was sessile. The polyp       was removed with a cold snare. Resection and retrieval were complete.       Estimated blood loss was minimal.      A 4 mm polyp was found in the descending colon.  The polyp was sessile.       The polyp was removed with a cold snare. Resection and retrieval were       complete. Estimated blood loss was minimal.      A few small-mouthed diverticula were found in the sigmoid colon.      The exam was otherwise without abnormality on direct and retroflexion       views. Impression:            - One 3 mm polyp in the cecum, removed with a cold                         snare. Resected and retrieved.                        - One 4 mm polyp in the descending colon, removed with                         a cold snare. Resected and retrieved.                        - Diverticulosis in the sigmoid colon.                        - The examination was otherwise normal on direct and                         retroflexion views. Recommendation:        - Discharge patient to home.                        - Resume previous diet.                        - Continue present medications.                        - Await pathology results.                        -  Repeat colonoscopy date to be determined after                         pending pathology results are reviewed for                         surveillance.                        - Return to referring physician as previously                         scheduled. Procedure Code(s):     --- Professional ---                        [phone], Colonoscopy, flexible; with removal of                         tumor(s), polyp(s), or other lesion(s) by snare                         technique Diagnosis Code(s):     --- Professional ---                        Z86.010, Personal history of colonic polyps                        K63.5, Polyp of colon                        K57.30, Diverticulosis of large intestine without                         perforation or abscess without bleeding CPT copyright 2019 American Medical Association. All rights reserved. The codes documented in this report are preliminary and upon coder review may  be revised  to meet current compliance requirements. Andrey Farmer MD, MD 06/01/2021 11:33:09 AM Number of Addenda: 0 Note Initiated On: 06/01/2021 10:29 AM Scope Withdrawal Time: 0 hours 13 minutes 33 seconds  Total Procedure Duration: 0 hours 18 minutes 19 seconds  Estimated Blood Loss:  Estimated blood loss was minimal.      [facility]

## 2021-06-01 NOTE — H&P (Signed)
Outpatient short stay form Pre-procedure 06/01/2021 10:39 AM Judith Miyamoto MD, MPH  Primary Physician: Dr. Kary Kos  Reason for visit:  Epigastric pain and history of polyps  History of present illness:   70 y/o lady with history of morbid obesity, hypertension, and DM II here for EGD for epigastric pain and colonoscopy for history of adenomatous polyps on colonoscopy done in 2014. No blood thinners. History of hysterectomy and cholecystectomy. No family history of GI malignancies.    Current Facility-Administered Medications:    0.9 %  sodium chloride infusion, , Intravenous, Continuous, Melaya Hoselton, Hilton Cork, MD, Last Rate: 20 mL/hr at 06/01/21 1031, Continued from Pre-op at 06/01/21 1031  Medications Prior to Admission  Medication Sig Dispense Refill Last Dose   acetaminophen (TYLENOL) 500 MG tablet Take 500-1,000 mg by mouth every 6 (six) hours as needed (for pain.).   Past Week   escitalopram (LEXAPRO) 5 MG tablet Take 5 mg by mouth daily.   Past Week   fluticasone (FLONASE) 50 MCG/ACT nasal spray Place 2 sprays into both nostrils daily.   Past Week   HYDROcodone-acetaminophen (NORCO/VICODIN) 5-325 MG tablet Take 1 tablet by mouth every 6 (six) hours as needed for moderate pain.   Past Week   losartan-hydrochlorothiazide (HYZAAR) 100-25 MG tablet Take 1 tablet by mouth daily.   Past Week   metFORMIN (GLUCOPHAGE-XR) 500 MG 24 hr tablet Take 500 mg by mouth daily with supper.    Past Week   naproxen (NAPROSYN) 500 MG tablet Take 500 mg by mouth 2 (two) times daily as needed.    Past Week   ondansetron (ZOFRAN ODT) 4 MG disintegrating tablet Take 1 tablet (4 mg total) by mouth every 8 (eight) hours as needed for nausea or vomiting. 20 tablet 0 Past Week   pantoprazole (PROTONIX) 40 MG tablet Take 40 mg by mouth daily.   Past Week   simvastatin (ZOCOR) 40 MG tablet Take 40 mg by mouth every evening.    Past Week   sucralfate (CARAFATE) 1 g tablet Take 1 g by mouth 2 (two) times daily before  a meal.   Past Week   famotidine (PEPCID) 20 MG tablet Take 1 tablet (20 mg total) by mouth 2 (two) times daily for 10 days. (Patient not taking: Reported on 08/01/2020) 20 tablet 0      No Known Allergies   Past Medical History:  Diagnosis Date   Arthritis    Cellulitis    Deaf    Diabetes mellitus without complication ([place])    High cholesterol    Hypertension    Obesity     Review of systems:  Otherwise negative.    Physical Exam  Gen: Alert, oriented. Appears stated age.  HEENT: PERRLA. Lungs: No respiratory distress CV: RRR Abd: soft, benign, no masses Ext: No edema    Planned procedures: Proceed with EGD/colonoscopy. The patient understands the nature of the planned procedure, indications, risks, alternatives and potential complications including but not limited to bleeding, infection, perforation, damage to internal organs and possible oversedation/side effects from anesthesia. The patient agrees and gives consent to proceed.  Please refer to procedure notes for findings, recommendations and patient disposition/instructions.     Judith Miyamoto MD, MPH Gastroenterology 06/01/2021  10:39 AM

## 2021-06-01 NOTE — Transfer of Care (Signed)
Immediate Anesthesia Transfer of Care Note  Patient: Judith Mitchell  Procedure(s) Performed: ESOPHAGOGASTRODUODENOSCOPY (EGD) WITH PROPOFOL COLONOSCOPY WITH PROPOFOL  Patient Location: ENDO pacu  Anesthesia Type:General  Level of Consciousness: alert   Airway & Oxygen Therapy: Patient Spontanous Breathing  Post-op Assessment: Report given to RN  Post vital signs: stable  Last Vitals:  Vitals Value Taken Time  BP 134/53 06/01/21 1123  Temp 35.8 C 06/01/21 1123  Pulse 59 06/01/21 1125  Resp 26 06/01/21 1125  SpO2 96 % 06/01/21 1125  Vitals shown include unvalidated device data.  Last Pain:  Vitals:   06/01/21 1123  TempSrc: Temporal  PainSc: Asleep         Complications: No notable events documented.

## 2021-06-01 NOTE — Interval H&P Note (Signed)
History and Physical Interval Note:  06/01/2021 10:43 AM  Judith Mitchell  has presented today for surgery, with the diagnosis of history of adenomatous polyp of colon epigastric pain.  The various methods of treatment have been discussed with the patient and family. After consideration of risks, benefits and other options for treatment, the patient has consented to  Procedure(s) with comments: ESOPHAGOGASTRODUODENOSCOPY (EGD) WITH PROPOFOL (N/A) - DM SIGN LANGUAGE INTERPRETER COLONOSCOPY WITH PROPOFOL (N/A) as a surgical intervention.  The patient's history has been reviewed, patient examined, no change in status, stable for surgery.  I have reviewed the patient's chart and labs.  Questions were answered to the patient's satisfaction.     Lesly Rubenstein  Ok to proceed with EGD/Colonoscopy

## 2021-06-04 ENCOUNTER — Encounter: Payer: Self-pay | Admitting: Gastroenterology

## 2021-06-04 LAB — SURGICAL PATHOLOGY

## 2021-07-24 DIAGNOSIS — R1013 Epigastric pain: Secondary | ICD-10-CM | POA: Diagnosis not present

## 2021-07-24 DIAGNOSIS — K2950 Unspecified chronic gastritis without bleeding: Secondary | ICD-10-CM | POA: Diagnosis not present

## 2021-09-13 DIAGNOSIS — B029 Zoster without complications: Secondary | ICD-10-CM | POA: Diagnosis not present

## 2021-09-14 DIAGNOSIS — K2970 Gastritis, unspecified, without bleeding: Secondary | ICD-10-CM | POA: Diagnosis not present

## 2021-09-15 DIAGNOSIS — B028 Zoster with other complications: Secondary | ICD-10-CM | POA: Diagnosis not present

## 2021-09-17 DIAGNOSIS — F419 Anxiety disorder, unspecified: Secondary | ICD-10-CM | POA: Diagnosis not present

## 2021-09-17 DIAGNOSIS — B029 Zoster without complications: Secondary | ICD-10-CM | POA: Diagnosis not present

## 2021-09-17 DIAGNOSIS — I10 Essential (primary) hypertension: Secondary | ICD-10-CM | POA: Diagnosis not present

## 2021-11-14 DIAGNOSIS — R5383 Other fatigue: Secondary | ICD-10-CM | POA: Diagnosis not present

## 2021-11-14 DIAGNOSIS — Z03818 Encounter for observation for suspected exposure to other biological agents ruled out: Secondary | ICD-10-CM | POA: Diagnosis not present

## 2021-11-14 DIAGNOSIS — R0600 Dyspnea, unspecified: Secondary | ICD-10-CM | POA: Diagnosis not present

## 2021-11-14 DIAGNOSIS — F419 Anxiety disorder, unspecified: Secondary | ICD-10-CM | POA: Diagnosis not present

## 2021-11-28 DIAGNOSIS — I872 Venous insufficiency (chronic) (peripheral): Secondary | ICD-10-CM | POA: Diagnosis not present

## 2021-11-28 DIAGNOSIS — I10 Essential (primary) hypertension: Secondary | ICD-10-CM | POA: Diagnosis not present

## 2021-11-28 DIAGNOSIS — R110 Nausea: Secondary | ICD-10-CM | POA: Diagnosis not present

## 2021-11-28 DIAGNOSIS — E785 Hyperlipidemia, unspecified: Secondary | ICD-10-CM | POA: Diagnosis not present

## 2021-11-28 DIAGNOSIS — B029 Zoster without complications: Secondary | ICD-10-CM | POA: Diagnosis not present

## 2021-11-28 DIAGNOSIS — Z6841 Body Mass Index (BMI) 40.0 and over, adult: Secondary | ICD-10-CM | POA: Diagnosis not present

## 2021-11-28 DIAGNOSIS — R519 Headache, unspecified: Secondary | ICD-10-CM | POA: Diagnosis not present

## 2021-11-28 DIAGNOSIS — E1165 Type 2 diabetes mellitus with hyperglycemia: Secondary | ICD-10-CM | POA: Diagnosis not present

## 2021-12-31 DIAGNOSIS — Z961 Presence of intraocular lens: Secondary | ICD-10-CM | POA: Diagnosis not present

## 2022-01-10 ENCOUNTER — Other Ambulatory Visit: Payer: Self-pay

## 2022-01-10 ENCOUNTER — Ambulatory Visit (INDEPENDENT_AMBULATORY_CARE_PROVIDER_SITE_OTHER): Payer: PPO | Admitting: Nurse Practitioner

## 2022-01-10 ENCOUNTER — Encounter (INDEPENDENT_AMBULATORY_CARE_PROVIDER_SITE_OTHER): Payer: Self-pay | Admitting: Nurse Practitioner

## 2022-01-10 VITALS — BP 193/83 | HR 75 | Resp 16 | Wt 288.0 lb

## 2022-01-10 DIAGNOSIS — R42 Dizziness and giddiness: Secondary | ICD-10-CM

## 2022-01-10 DIAGNOSIS — M7989 Other specified soft tissue disorders: Secondary | ICD-10-CM

## 2022-01-13 ENCOUNTER — Encounter (INDEPENDENT_AMBULATORY_CARE_PROVIDER_SITE_OTHER): Payer: Self-pay | Admitting: Nurse Practitioner

## 2022-01-13 NOTE — Progress Notes (Signed)
Subjective:    Patient ID: Judith Mitchell, female    DOB: August 18, 1951, 71 y.o.   MRN: [id number] Chief Complaint  Patient presents with   New Patient (Initial Visit)    Ref Burnett Sheng for venous insufficiency    Patient is seen for evaluation of leg swelling. The patient first noticed the swelling remotely but is now concerned because of a significant increase in the overall edema. The swelling is associated with pain and discoloration. The patient notes that in the morning the legs are significantly improved but they steadily worsened throughout the course of the day. Elevation makes the legs better, dependency makes them much worse.   There is no history of ulcerations associated with the swelling.   The patient denies any recent changes in their medications.  The patient has not been wearing graduated compression.  The patient has no had any past angiography, interventions or vascular surgery.  The patient denies a history of DVT or PE. There is no prior history of phlebitis. There is no history of primary lymphedema.  There is no history of radiation treatment to the groin or pelvis No history of malignancies. No history of trauma or groin or pelvic surgery. No history of foreign travel or parasitic infections area     The patient is also particularly concerned for dizziness that she has had.  The dizziness has been profound.   Review of Systems  HENT:  Positive for hearing loss.   Cardiovascular:  Positive for leg swelling.  Neurological:  Positive for dizziness.  All other systems reviewed and are negative.     Objective:   Physical Exam Vitals reviewed.  HENT:     Head: Normocephalic.  Neck:     Vascular: No carotid bruit.  Cardiovascular:     Rate and Rhythm: Normal rate.     Pulses: Normal pulses.  Pulmonary:     Effort: Pulmonary effort is normal.  Skin:    General: Skin is warm and dry.  Neurological:     Mental Status: She is alert and oriented to  person, place, and time.  Psychiatric:        Mood and Affect: Mood normal.        Behavior: Behavior normal.        Thought Content: Thought content normal.        Judgment: Judgment normal.    BP (!) 193/83 (BP Location: Right Arm)   Pulse 75   Resp 16   Wt 288 lb (130.6 kg)   BMI 47.93 kg/m   Past Medical History:  Diagnosis Date   Arthritis    Cellulitis    Deaf    Diabetes mellitus without complication (HCC)    High cholesterol    Hypertension    Obesity     Social History   Socioeconomic History   Marital status: Married    Spouse name: Not on file   Number of children: Not on file   Years of education: Not on file   Highest education level: Not on file  Occupational History   Not on file  Tobacco Use   Smoking status: Never   Smokeless tobacco: Never  Vaping Use   Vaping Use: Never used  Substance and Sexual Activity   Alcohol use: No   Drug use: No   Sexual activity: Not on file  Other Topics Concern   Not on file  Social History Narrative   Not on file   Social Determinants of Health  Financial Resource Strain: Not on file  Food Insecurity: Not on file  Transportation Needs: Not on file  Physical Activity: Not on file  Stress: Not on file  Social Connections: Not on file  Intimate Partner Violence: Not on file    Past Surgical History:  Procedure Laterality Date   ABDOMINAL HYSTERECTOMY     CATARACT EXTRACTION W/PHACO Left 07/06/2020   Procedure: CATARACT EXTRACTION PHACO AND INTRAOCULAR LENS PLACEMENT (IOC);  Surgeon: Galen Manila, MD;  Location: [facility] ORS;  Service: Ophthalmology;  Laterality: Left;  [place] 00:23.7 CDE 4.47 AP% 17.8 Fluid pack lot # [id number]   CATARACT EXTRACTION W/PHACO Right 05/01/2021   Procedure: CATARACT EXTRACTION PHACO AND INTRAOCULAR LENS PLACEMENT (IOC) RIGHT DIABETIC 4.08 00:27.6;  Surgeon: Galen Manila, MD;  Location: [facility];  Service: Ophthalmology;  Laterality: Right;   CHOLECYSTECTOMY      COLONOSCOPY WITH PROPOFOL N/A 06/01/2021   Procedure: COLONOSCOPY WITH PROPOFOL;  Surgeon: Regis Bill, MD;  Location: [facility] ENDOSCOPY;  Service: Endoscopy;  Laterality: N/A;   ESOPHAGOGASTRODUODENOSCOPY (EGD) WITH PROPOFOL N/A 01/29/2016   Procedure: ESOPHAGOGASTRODUODENOSCOPY (EGD) WITH PROPOFOL;  Surgeon: Christena Deem, MD;  Location: [facility] END[facility];  Service: Endoscopy;  Laterality: N/A;   ESOPHAGOGASTRODUODENOSCOPY (EGD) WITH PROPOFOL N/A 06/01/2021   Procedure: ESOPHAGOGASTRODUODENOSCOPY (EGD) WITH PROPOFOL;  Surgeon: Regis Bill, MD;  Location: [facility] ENDOSCOPY;  Service: Endoscopy;  Laterality: N/A;  DM SIGN LANGUAGE INTERPRETER    Family History  Problem Relation Age of Onset   Asthma Mother    Hypertension Mother    Osteoporosis Mother    Emphysema Mother    Heart attack Mother    Heart attack Father    Hypertension Father     Allergies  Allergen Reactions   Aspirin Buffered Nausea Only    CBC Latest Ref Rng & Units 01/27/2021 02/13/2020 11/01/2019  WBC 4.0 - 10.5 K/uL 6.9 4.4 3.4(L)  Hemoglobin 12.0 - 15.0 g/dL 47.4 25.9 56.3  Hematocrit 36.0 - 46.0 % 43.3 37.7 38.0  Platelets 150 - 400 K/uL 143(L) 144(L) 120(L)      CMP     Component Value Date/Time   NA 138 01/27/2021 2141   NA 142 09/18/2014 1303   K 3.4 (L) 01/27/2021 2141   K 3.8 09/18/2014 1303   CL 102 01/27/2021 2141   CL 106 09/18/2014 1303   CO2 27 01/27/2021 2141   CO2 30 09/18/2014 1303   GLUCOSE 202 (H) 01/27/2021 2141   GLUCOSE 93 09/18/2014 1303   BUN 23 01/27/2021 2141   BUN 21 (H) 09/18/2014 1303   CREATININE 0.82 01/27/2021 2141   CREATININE 0.80 09/18/2014 1303   CALCIUM 9.0 01/27/2021 2141   CALCIUM 9.1 09/18/2014 1303   PROT 6.8 01/27/2021 2141   PROT 7.0 09/18/2014 1303   ALBUMIN 3.6 01/27/2021 2141   ALBUMIN 3.3 (L) 09/18/2014 1303   AST 23 01/27/2021 2141   AST 19 09/18/2014 1303   ALT 17 01/27/2021 2141   ALT 23 09/18/2014 1303   ALKPHOS 78 01/27/2021 2141    ALKPHOS 82 09/18/2014 1303   BILITOT 1.0 01/27/2021 2141   BILITOT 0.4 09/18/2014 1303   GFRNONAA >60 01/27/2021 2141   GFRNONAA >60 09/18/2014 1303   GFRNONAA >60 06/13/2014 0529   GFRAA >60 02/13/2020 1121   GFRAA >60 09/18/2014 1303   GFRAA >60 06/13/2014 0529     No results found.     Assessment & Plan:   1. Dizziness The patient complains of dizziness today which is  very distressing for her.  It has caused her to lose her job.  We discussed possible causes, some possibly vascular.  The patient undergo a carotid artery duplex to evaluate for these causes.  It was stressed to the patient that this may not be the only cause and so she should continue to talk with her PCP for further work-up and evaluation  2. Leg swelling I have had a long discussion with the patient regarding swelling and why it  causes symptoms.  Patient will begin wearing graduated compression stockings class 1 (20-30 mmHg) on a daily basis a prescription was given. The patient will  beginning wearing the stockings first thing in the morning and removing them in the evening. The patient is instructed specifically not to sleep in the stockings.   In addition, behavioral modification will be initiated.  This will include frequent elevation, use of over the counter pain medications and exercise such as walking.  I have reviewed systemic causes for chronic edema such as liver, kidney and cardiac etiologies.  The patient denies problems with these organ systems.    Consideration for a lymph pump will also be made based upon the effectiveness of conservative therapy.  This would help to improve the edema control and prevent sequela such as ulcers and infections   Patient should undergo duplex ultrasound of the venous system to ensure that DVT or reflux is not present.  The patient will follow-up with me after the ultrasound.     Current Outpatient Medications on File Prior to Visit  Medication Sig Dispense Refill    acetaminophen (TYLENOL) 500 MG tablet Take 500-1,000 mg by mouth every 6 (six) hours as needed (for pain.).     famotidine (PEPCID) 20 MG tablet Take 1 tablet (20 mg total) by mouth 2 (two) times daily for 10 days. 20 tablet 0   HYDROcodone-acetaminophen (NORCO/VICODIN) 5-325 MG tablet Take 1 tablet by mouth every 6 (six) hours as needed for moderate pain.     metFORMIN (GLUCOPHAGE-XR) 500 MG 24 hr tablet Take 500 mg by mouth daily with supper.      naproxen (NAPROSYN) 500 MG tablet Take 500 mg by mouth 2 (two) times daily as needed.      simvastatin (ZOCOR) 40 MG tablet Take 40 mg by mouth every evening.      escitalopram (LEXAPRO) 5 MG tablet Take 5 mg by mouth daily. (Patient not taking: Reported on 01/10/2022)     fluticasone (FLONASE) 50 MCG/ACT nasal spray Place 2 sprays into both nostrils daily. (Patient not taking: Reported on 01/10/2022)     losartan-hydrochlorothiazide (HYZAAR) 100-25 MG tablet Take 1 tablet by mouth daily. (Patient not taking: Reported on 01/10/2022)     ondansetron (ZOFRAN ODT) 4 MG disintegrating tablet Take 1 tablet (4 mg total) by mouth every 8 (eight) hours as needed for nausea or vomiting. (Patient not taking: Reported on 01/10/2022) 20 tablet 0   pantoprazole (PROTONIX) 40 MG tablet Take 40 mg by mouth daily. (Patient not taking: Reported on 01/10/2022)     sucralfate (CARAFATE) 1 g tablet Take 1 g by mouth 2 (two) times daily before a meal. (Patient not taking: Reported on 01/10/2022)     No current facility-administered medications on file prior to visit.    There are no Patient Instructions on file for this visit. No follow-ups on file.   Georgiana Spinner, NP

## 2022-01-30 ENCOUNTER — Other Ambulatory Visit (INDEPENDENT_AMBULATORY_CARE_PROVIDER_SITE_OTHER): Payer: Self-pay | Admitting: Nurse Practitioner

## 2022-01-30 DIAGNOSIS — M7989 Other specified soft tissue disorders: Secondary | ICD-10-CM

## 2022-01-30 DIAGNOSIS — R42 Dizziness and giddiness: Secondary | ICD-10-CM

## 2022-01-31 ENCOUNTER — Ambulatory Visit (INDEPENDENT_AMBULATORY_CARE_PROVIDER_SITE_OTHER): Payer: PPO

## 2022-01-31 ENCOUNTER — Ambulatory Visit (INDEPENDENT_AMBULATORY_CARE_PROVIDER_SITE_OTHER): Payer: PPO | Admitting: Nurse Practitioner

## 2022-01-31 DIAGNOSIS — R42 Dizziness and giddiness: Secondary | ICD-10-CM

## 2022-01-31 DIAGNOSIS — M7989 Other specified soft tissue disorders: Secondary | ICD-10-CM | POA: Diagnosis not present

## 2022-02-19 DIAGNOSIS — R1011 Right upper quadrant pain: Secondary | ICD-10-CM | POA: Diagnosis not present

## 2022-03-05 DIAGNOSIS — E119 Type 2 diabetes mellitus without complications: Secondary | ICD-10-CM | POA: Diagnosis not present

## 2022-03-13 DIAGNOSIS — E876 Hypokalemia: Secondary | ICD-10-CM | POA: Diagnosis not present

## 2022-03-13 DIAGNOSIS — M79644 Pain in right finger(s): Secondary | ICD-10-CM | POA: Diagnosis not present

## 2022-06-24 DIAGNOSIS — I878 Other specified disorders of veins: Secondary | ICD-10-CM | POA: Diagnosis not present

## 2022-06-24 DIAGNOSIS — L03116 Cellulitis of left lower limb: Secondary | ICD-10-CM | POA: Diagnosis not present

## 2022-10-02 ENCOUNTER — Telehealth: Payer: Self-pay | Admitting: *Deleted

## 2022-10-02 NOTE — Patient Outreach (Signed)
  Care Coordination   10/02/2022 Name: Judith Mitchell MRN: [id number] DOB: 25-Oct-1951   Care Coordination Outreach Attempts:  An unsuccessful telephone outreach was attempted today to offer the patient information about available care coordination services as a benefit of their health plan.   Follow Up Plan:  Additional outreach attempts will be made to offer the patient care coordination information and services.   Encounter Outcome:  No Answer   Care Coordination Interventions:  No, not indicated    Valente David, RN, MSN, [facility] Care Management Care Management Coordinator [phone]

## 2022-10-08 ENCOUNTER — Telehealth: Payer: Self-pay | Admitting: *Deleted

## 2022-10-08 NOTE — Patient Outreach (Signed)
  Care Coordination   10/08/2022 Name: Judith Mitchell MRN: [id number] DOB: 14-Jun-1951   Care Coordination Outreach Attempts:  A second unsuccessful outreach was attempted today to offer the patient with information about available care coordination services as a benefit of their health plan.     Follow Up Plan:  Additional outreach attempts will be made to offer the patient care coordination information and services.   Encounter Outcome:  No Answer   Care Coordination Interventions:  No, not indicated    Valente David, RN, MSN, [facility] Care Management Care Management Coordinator [phone]

## 2022-10-11 ENCOUNTER — Encounter: Payer: Self-pay | Admitting: *Deleted

## 2022-10-11 ENCOUNTER — Telehealth: Payer: Self-pay | Admitting: *Deleted

## 2022-10-11 NOTE — Patient Outreach (Signed)
  Care Coordination   Initial Visit Note   10/11/2022 Name: CLAUDIE BRICKHOUSE MRN: [id number] DOB: 01/22/1951  RENELDA KILIAN is a 71 y.o. year old female who sees Maryland Pink, MD for primary care. I spoke with  Dutch Gray by phone today.  What matters to the patients health and wellness today?  Recently started a new job, does not have much time to participate but agrees to call if she has questions.  Denies any urgent concerns, encouraged to contact this care manager with questions.        SDOH assessments and interventions completed:  Yes  SDOH Interventions Today    Flowsheet Row Most Recent Value  SDOH Interventions   Food Insecurity Interventions Intervention Not Indicated  Housing Interventions Intervention Not Indicated  Transportation Interventions Intervention Not Indicated  Utilities Interventions Intervention Not Indicated        Care Coordination Interventions:  Yes, provided   Follow up plan: No further intervention required.   Encounter Outcome:  Pt. Visit Completed   Valente David, RN, MSN, [place] Care Management Care Management Coordinator [phone]

## 2022-12-11 DIAGNOSIS — Z6841 Body Mass Index (BMI) 40.0 and over, adult: Secondary | ICD-10-CM | POA: Diagnosis not present

## 2022-12-11 DIAGNOSIS — E119 Type 2 diabetes mellitus without complications: Secondary | ICD-10-CM | POA: Diagnosis not present

## 2022-12-11 DIAGNOSIS — L608 Other nail disorders: Secondary | ICD-10-CM | POA: Diagnosis not present

## 2022-12-11 DIAGNOSIS — I878 Other specified disorders of veins: Secondary | ICD-10-CM | POA: Diagnosis not present

## 2022-12-11 DIAGNOSIS — L298 Other pruritus: Secondary | ICD-10-CM | POA: Diagnosis not present

## 2023-01-27 DIAGNOSIS — K921 Melena: Secondary | ICD-10-CM | POA: Diagnosis not present

## 2023-01-27 DIAGNOSIS — R051 Acute cough: Secondary | ICD-10-CM | POA: Diagnosis not present

## 2023-01-27 DIAGNOSIS — Z03818 Encounter for observation for suspected exposure to other biological agents ruled out: Secondary | ICD-10-CM | POA: Diagnosis not present

## 2023-01-27 DIAGNOSIS — R195 Other fecal abnormalities: Secondary | ICD-10-CM | POA: Diagnosis not present

## 2023-01-27 DIAGNOSIS — U071 COVID-19: Secondary | ICD-10-CM | POA: Diagnosis not present

## 2023-03-03 DIAGNOSIS — R051 Acute cough: Secondary | ICD-10-CM | POA: Diagnosis not present

## 2023-03-03 DIAGNOSIS — R1013 Epigastric pain: Secondary | ICD-10-CM | POA: Diagnosis not present

## 2023-04-07 ENCOUNTER — Encounter: Payer: Self-pay | Admitting: Family Medicine

## 2023-04-10 ENCOUNTER — Other Ambulatory Visit: Payer: Self-pay | Admitting: Family Medicine

## 2023-04-10 DIAGNOSIS — N6489 Other specified disorders of breast: Secondary | ICD-10-CM

## 2023-06-13 DIAGNOSIS — L03116 Cellulitis of left lower limb: Secondary | ICD-10-CM | POA: Diagnosis not present

## 2023-06-13 DIAGNOSIS — L989 Disorder of the skin and subcutaneous tissue, unspecified: Secondary | ICD-10-CM | POA: Diagnosis not present

## 2023-06-13 DIAGNOSIS — L602 Onychogryphosis: Secondary | ICD-10-CM | POA: Diagnosis not present

## 2023-07-08 DIAGNOSIS — I10 Essential (primary) hypertension: Secondary | ICD-10-CM | POA: Diagnosis not present

## 2023-07-08 DIAGNOSIS — E119 Type 2 diabetes mellitus without complications: Secondary | ICD-10-CM | POA: Diagnosis not present

## 2023-07-08 DIAGNOSIS — L97929 Non-pressure chronic ulcer of unspecified part of left lower leg with unspecified severity: Secondary | ICD-10-CM | POA: Diagnosis not present

## 2023-07-08 DIAGNOSIS — I83029 Varicose veins of left lower extremity with ulcer of unspecified site: Secondary | ICD-10-CM | POA: Diagnosis not present

## 2023-07-08 DIAGNOSIS — Z6841 Body Mass Index (BMI) 40.0 and over, adult: Secondary | ICD-10-CM | POA: Diagnosis not present

## 2023-07-08 DIAGNOSIS — J309 Allergic rhinitis, unspecified: Secondary | ICD-10-CM | POA: Diagnosis not present

## 2023-07-08 DIAGNOSIS — H9193 Unspecified hearing loss, bilateral: Secondary | ICD-10-CM | POA: Diagnosis not present

## 2023-07-08 DIAGNOSIS — K219 Gastro-esophageal reflux disease without esophagitis: Secondary | ICD-10-CM | POA: Diagnosis not present

## 2023-07-08 DIAGNOSIS — R600 Localized edema: Secondary | ICD-10-CM | POA: Diagnosis not present

## 2023-07-08 DIAGNOSIS — M65341 Trigger finger, right ring finger: Secondary | ICD-10-CM | POA: Diagnosis not present

## 2023-07-08 DIAGNOSIS — E785 Hyperlipidemia, unspecified: Secondary | ICD-10-CM | POA: Diagnosis not present

## 2023-07-15 DIAGNOSIS — I83023 Varicose veins of left lower extremity with ulcer of ankle: Secondary | ICD-10-CM | POA: Diagnosis not present

## 2023-07-15 DIAGNOSIS — L97329 Non-pressure chronic ulcer of left ankle with unspecified severity: Secondary | ICD-10-CM | POA: Diagnosis not present

## 2023-07-22 DIAGNOSIS — L97329 Non-pressure chronic ulcer of left ankle with unspecified severity: Secondary | ICD-10-CM | POA: Diagnosis not present

## 2023-07-22 DIAGNOSIS — I872 Venous insufficiency (chronic) (peripheral): Secondary | ICD-10-CM | POA: Diagnosis not present

## 2023-07-30 DIAGNOSIS — M65341 Trigger finger, right ring finger: Secondary | ICD-10-CM | POA: Diagnosis not present

## 2023-07-31 ENCOUNTER — Other Ambulatory Visit: Payer: PPO

## 2023-08-14 ENCOUNTER — Ambulatory Visit
Admission: RE | Admit: 2023-08-14 | Discharge: 2023-08-14 | Disposition: A | Discharge status: Home / Self Care | Payer: PPO | Source: Ambulatory Visit | Attending: Family Medicine | Admitting: Family Medicine

## 2023-08-14 DIAGNOSIS — N6489 Other specified disorders of breast: Secondary | ICD-10-CM | POA: Insufficient documentation

## 2023-08-14 DIAGNOSIS — R928 Other abnormal and inconclusive findings on diagnostic imaging of breast: Secondary | ICD-10-CM | POA: Diagnosis not present

## 2023-08-14 DIAGNOSIS — R92313 Mammographic fatty tissue density, bilateral breasts: Secondary | ICD-10-CM | POA: Diagnosis not present

## 2023-09-17 DIAGNOSIS — I10 Essential (primary) hypertension: Secondary | ICD-10-CM | POA: Diagnosis not present

## 2023-09-17 DIAGNOSIS — E119 Type 2 diabetes mellitus without complications: Secondary | ICD-10-CM | POA: Diagnosis not present

## 2023-09-17 DIAGNOSIS — K5790 Diverticulosis of intestine, part unspecified, without perforation or abscess without bleeding: Secondary | ICD-10-CM | POA: Diagnosis not present

## 2023-09-17 DIAGNOSIS — Z860100 Personal history of colon polyps, unspecified: Secondary | ICD-10-CM | POA: Diagnosis not present

## 2023-09-17 DIAGNOSIS — R1032 Left lower quadrant pain: Secondary | ICD-10-CM | POA: Diagnosis not present

## 2023-09-17 DIAGNOSIS — Z6841 Body Mass Index (BMI) 40.0 and over, adult: Secondary | ICD-10-CM | POA: Diagnosis not present

## 2023-09-17 DIAGNOSIS — K219 Gastro-esophageal reflux disease without esophagitis: Secondary | ICD-10-CM | POA: Diagnosis not present

## 2023-10-24 DIAGNOSIS — I83029 Varicose veins of left lower extremity with ulcer of unspecified site: Secondary | ICD-10-CM | POA: Diagnosis not present

## 2023-10-24 DIAGNOSIS — M6530 Trigger finger, unspecified finger: Secondary | ICD-10-CM | POA: Diagnosis not present

## 2023-10-24 DIAGNOSIS — L03116 Cellulitis of left lower limb: Secondary | ICD-10-CM | POA: Diagnosis not present

## 2023-10-24 DIAGNOSIS — L97929 Non-pressure chronic ulcer of unspecified part of left lower leg with unspecified severity: Secondary | ICD-10-CM | POA: Diagnosis not present

## 2023-11-07 DIAGNOSIS — L97929 Non-pressure chronic ulcer of unspecified part of left lower leg with unspecified severity: Secondary | ICD-10-CM | POA: Diagnosis not present

## 2023-11-07 DIAGNOSIS — E119 Type 2 diabetes mellitus without complications: Secondary | ICD-10-CM | POA: Diagnosis not present

## 2023-11-07 DIAGNOSIS — Z6841 Body Mass Index (BMI) 40.0 and over, adult: Secondary | ICD-10-CM | POA: Diagnosis not present

## 2023-11-07 DIAGNOSIS — I83029 Varicose veins of left lower extremity with ulcer of unspecified site: Secondary | ICD-10-CM | POA: Diagnosis not present

## 2023-11-07 DIAGNOSIS — I10 Essential (primary) hypertension: Secondary | ICD-10-CM | POA: Diagnosis not present

## 2023-11-07 DIAGNOSIS — M65342 Trigger finger, left ring finger: Secondary | ICD-10-CM | POA: Diagnosis not present

## 2023-11-07 DIAGNOSIS — M654 Radial styloid tenosynovitis [de Quervain]: Secondary | ICD-10-CM | POA: Diagnosis not present

## 2023-11-17 DIAGNOSIS — M2042 Other hammer toe(s) (acquired), left foot: Secondary | ICD-10-CM | POA: Diagnosis not present

## 2023-11-17 DIAGNOSIS — L600 Ingrowing nail: Secondary | ICD-10-CM | POA: Diagnosis not present

## 2023-11-17 DIAGNOSIS — L851 Acquired keratosis [keratoderma] palmaris et plantaris: Secondary | ICD-10-CM | POA: Diagnosis not present

## 2023-11-17 DIAGNOSIS — E1140 Type 2 diabetes mellitus with diabetic neuropathy, unspecified: Secondary | ICD-10-CM | POA: Diagnosis not present

## 2023-11-17 DIAGNOSIS — B351 Tinea unguium: Secondary | ICD-10-CM | POA: Diagnosis not present

## 2023-12-02 ENCOUNTER — Encounter: Payer: PPO | Attending: Physician Assistant | Admitting: Physician Assistant

## 2023-12-02 DIAGNOSIS — I10 Essential (primary) hypertension: Secondary | ICD-10-CM | POA: Diagnosis not present

## 2023-12-02 DIAGNOSIS — I87332 Chronic venous hypertension (idiopathic) with ulcer and inflammation of left lower extremity: Secondary | ICD-10-CM | POA: Diagnosis not present

## 2023-12-02 DIAGNOSIS — H913 Deaf nonspeaking, not elsewhere classified: Secondary | ICD-10-CM | POA: Diagnosis not present

## 2023-12-02 DIAGNOSIS — L97822 Non-pressure chronic ulcer of other part of left lower leg with fat layer exposed: Secondary | ICD-10-CM | POA: Diagnosis not present

## 2023-12-02 DIAGNOSIS — E11622 Type 2 diabetes mellitus with other skin ulcer: Secondary | ICD-10-CM | POA: Insufficient documentation

## 2023-12-10 ENCOUNTER — Encounter: Payer: PPO | Admitting: Physician Assistant

## 2023-12-10 DIAGNOSIS — I10 Essential (primary) hypertension: Secondary | ICD-10-CM | POA: Diagnosis not present

## 2023-12-10 DIAGNOSIS — I87332 Chronic venous hypertension (idiopathic) with ulcer and inflammation of left lower extremity: Secondary | ICD-10-CM | POA: Diagnosis not present

## 2024-03-02 DIAGNOSIS — L03115 Cellulitis of right lower limb: Secondary | ICD-10-CM | POA: Diagnosis not present

## 2024-03-02 DIAGNOSIS — I878 Other specified disorders of veins: Secondary | ICD-10-CM | POA: Diagnosis not present

## 2024-04-27 DIAGNOSIS — L03119 Cellulitis of unspecified part of limb: Secondary | ICD-10-CM | POA: Diagnosis not present

## 2024-04-27 DIAGNOSIS — R600 Localized edema: Secondary | ICD-10-CM | POA: Diagnosis not present

## 2024-04-27 NOTE — Progress Notes (Signed)
 Judith Mitchell is a 73 y.o. female in clinic today for rash and lower extremity edema.  Sign language interpreter utilized during appointment.  HPI: History of Present Illness Judith Mitchell is a 74 year old female who presents with blisters and swelling on her legs.  She has been experiencing blisters on her legs, which worsened this morning. The blisters are associated with itching and improve after they release fluid. She describes the sensation as 'funny' and notes that the blisters appear when her legs are swollen. Patient notes she is supposed to be wearing compression hose but did not get them due to cost. She notes she should be able to next week.   She is currently taking hydrochlorothiazide, a diuretic, to help manage fluid retention. She is concerned about recent weight gain, noting an increase of six pounds over the last two months, and wonders if it is related to the fluid in her legs.   ROS: Review of Systems  Constitutional:  Negative for chills and fever.  Respiratory:  Negative for shortness of breath.   Cardiovascular:  Positive for leg swelling. Negative for chest pain and palpitations.  Skin:  Positive for itching and rash.     Allergies  Allergen Reactions  . Valomag [Asa,Buffd(Mag,Aluminum Hydrox)] Nausea    Past Medical History:  Diagnosis Date  . Cellulitis and abscess of leg 05/2014   Admitted to [facility]   . DM (diabetes mellitus), type 2 (CMS/HHS-HCC)   . History of chicken pox   . Hx of adenomatous colonic polyps   . Hyperlipidemia   . Hypertension   . Obesity   . Reflux esophagitis 01/29/2016      BP (!) 140/90   Pulse 82   Ht 165.1 cm (5' 5)   Wt (!) 140.3 kg (309 lb 6.4 oz)   SpO2 97%   BMI 51.49 kg/m    Physical Exam Constitutional:      General: She is not in acute distress.    Appearance: Normal appearance.  HENT:     Head: Normocephalic and atraumatic.  Pulmonary:     Effort: Pulmonary effort is normal.   Musculoskeletal:      Right lower leg: 3+ Pitting Edema present.     Left lower leg: 3+ Pitting Edema present.   Skin:    General: Skin is warm and dry.     Capillary Refill: Capillary refill takes 2 to 3 seconds.     Findings: Erythema and rash present. Rash is vesicular.     Comments: Patient has anterior blistering, erytema and weeping with scattered scabs to both lower extremities. Warm to touch. Present on posterior right leg as well. Pictures to chart.   Neurological:     General: No focal deficit present.     Mental Status: She is alert and oriented to person, place, and time.   Psychiatric:        Mood and Affect: Mood and affect normal.        Speech: Speech normal.        Thought Content: Thought content normal.      Plan: Assessment & Plan Leg blisters with possible infection Blisters likely due to fluid retention causing skin breakdown and potential infection. Antibiotic treatment necessary to prevent complications. Referral to wound care clinic discussed. - Prescribe amoxicillin 500 mg twice a day for 5 days. - Refer to wound care clinic for further management. - Advise to keep legs clean and dry. - Instruct to elevate legs as  much as possible. - Recommend using padded dressing over blisters, especially when wearing compression hose. - Advise to put the compression hose in the morning when legs are least swollen and remove at night.  Leg swelling Chronic leg swelling may contribute to blister formation. Currently on hydrochlorothiazide. Lifestyle modifications discussed to reduce swelling. - Advise to monitor salt intake to reduce fluid retention. - Encourage elevating feet during the day. - Recommend using compression hose to help force fluid out of legs.  Follow up: PRN

## 2024-05-20 ENCOUNTER — Encounter: Attending: Physician Assistant | Admitting: Physician Assistant

## 2024-05-20 DIAGNOSIS — L97822 Non-pressure chronic ulcer of other part of left lower leg with fat layer exposed: Secondary | ICD-10-CM | POA: Insufficient documentation

## 2024-05-20 DIAGNOSIS — I890 Lymphedema, not elsewhere classified: Secondary | ICD-10-CM | POA: Diagnosis not present

## 2024-05-20 DIAGNOSIS — E11622 Type 2 diabetes mellitus with other skin ulcer: Secondary | ICD-10-CM | POA: Insufficient documentation

## 2024-05-20 DIAGNOSIS — H913 Deaf nonspeaking, not elsewhere classified: Secondary | ICD-10-CM | POA: Insufficient documentation

## 2024-05-20 DIAGNOSIS — I10 Essential (primary) hypertension: Secondary | ICD-10-CM | POA: Diagnosis not present

## 2024-05-20 DIAGNOSIS — I87333 Chronic venous hypertension (idiopathic) with ulcer and inflammation of bilateral lower extremity: Secondary | ICD-10-CM | POA: Insufficient documentation

## 2024-05-20 DIAGNOSIS — L97812 Non-pressure chronic ulcer of other part of right lower leg with fat layer exposed: Secondary | ICD-10-CM | POA: Insufficient documentation

## 2024-05-24 DIAGNOSIS — I890 Lymphedema, not elsewhere classified: Secondary | ICD-10-CM | POA: Diagnosis not present

## 2024-05-27 ENCOUNTER — Encounter: Admitting: Physician Assistant

## 2024-05-27 DIAGNOSIS — E11622 Type 2 diabetes mellitus with other skin ulcer: Secondary | ICD-10-CM | POA: Diagnosis not present

## 2024-05-27 DIAGNOSIS — I87311 Chronic venous hypertension (idiopathic) with ulcer of right lower extremity: Secondary | ICD-10-CM | POA: Diagnosis not present

## 2024-05-27 DIAGNOSIS — L97812 Non-pressure chronic ulcer of other part of right lower leg with fat layer exposed: Secondary | ICD-10-CM | POA: Diagnosis not present

## 2024-06-07 ENCOUNTER — Encounter: Attending: Physician Assistant | Admitting: Physician Assistant

## 2024-06-07 DIAGNOSIS — I87333 Chronic venous hypertension (idiopathic) with ulcer and inflammation of bilateral lower extremity: Secondary | ICD-10-CM | POA: Insufficient documentation

## 2024-06-07 DIAGNOSIS — L97812 Non-pressure chronic ulcer of other part of right lower leg with fat layer exposed: Secondary | ICD-10-CM | POA: Diagnosis not present

## 2024-06-07 DIAGNOSIS — H913 Deaf nonspeaking, not elsewhere classified: Secondary | ICD-10-CM | POA: Diagnosis not present

## 2024-06-07 DIAGNOSIS — I10 Essential (primary) hypertension: Secondary | ICD-10-CM | POA: Insufficient documentation

## 2024-06-07 DIAGNOSIS — E11622 Type 2 diabetes mellitus with other skin ulcer: Secondary | ICD-10-CM | POA: Insufficient documentation

## 2024-06-07 DIAGNOSIS — L97822 Non-pressure chronic ulcer of other part of left lower leg with fat layer exposed: Secondary | ICD-10-CM | POA: Diagnosis not present

## 2024-06-07 DIAGNOSIS — I890 Lymphedema, not elsewhere classified: Secondary | ICD-10-CM | POA: Diagnosis not present

## 2024-06-07 DIAGNOSIS — I87331 Chronic venous hypertension (idiopathic) with ulcer and inflammation of right lower extremity: Secondary | ICD-10-CM | POA: Diagnosis not present

## 2024-06-10 ENCOUNTER — Encounter: Payer: Self-pay | Admitting: Podiatry

## 2024-06-10 ENCOUNTER — Ambulatory Visit: Admitting: Podiatry

## 2024-06-10 DIAGNOSIS — L600 Ingrowing nail: Secondary | ICD-10-CM

## 2024-06-10 NOTE — Patient Instructions (Signed)

## 2024-06-10 NOTE — Progress Notes (Unsigned)
 Subjective:  Patient ID: Judith Mitchell, female    DOB: 1951/02/18,  MRN: [id number]  Chief Complaint  Patient presents with   Nail Problem    73 y.o. female presents with the above complaint.  Patient presents with left great toe contusion.  Patient states the nail is coming loose.  She is a diabetic does not want to get it caught alone.  Has not seen anyone else prior to seeing.  Presents.  Pain scale 7 out of 10 dull aching nature she would like to have removed she does not want to make it permanent   Review of Systems: Negative except as noted in the HPI. Denies N/V/F/Ch.  Past Medical History:  Diagnosis Date   Arthritis    Cellulitis    Deaf    Diabetes mellitus without complication (HCC)    High cholesterol    Hypertension    Obesity     Current Outpatient Medications:    acetaminophen (TYLENOL) 500 MG tablet, Take 500-1,000 mg by mouth every 6 (six) hours as needed (for pain.)., Disp: , Rfl:    escitalopram (LEXAPRO) 5 MG tablet, Take 5 mg by mouth daily. (Patient not taking: Reported on 01/10/2022), Disp: , Rfl:    famotidine  (PEPCID ) 20 MG tablet, Take 1 tablet (20 mg total) by mouth 2 (two) times daily for 10 days., Disp: 20 tablet, Rfl: 0   fluticasone (FLONASE) 50 MCG/ACT nasal spray, Place 2 sprays into both nostrils daily. (Patient not taking: Reported on 01/10/2022), Disp: , Rfl:    HYDROcodone-acetaminophen (NORCO/VICODIN) 5-325 MG tablet, Take 1 tablet by mouth every 6 (six) hours as needed for moderate pain., Disp: , Rfl:    losartan-hydrochlorothiazide (HYZAAR) 100-25 MG tablet, Take 1 tablet by mouth daily. (Patient not taking: Reported on 01/10/2022), Disp: , Rfl:    metFORMIN (GLUCOPHAGE-XR) 500 MG 24 hr tablet, Take 500 mg by mouth daily with supper. , Disp: , Rfl:    naproxen (NAPROSYN) 500 MG tablet, Take 500 mg by mouth 2 (two) times daily as needed. , Disp: , Rfl:    ondansetron  (ZOFRAN  ODT) 4 MG disintegrating tablet, Take 1 tablet (4 mg total) by mouth  every 8 (eight) hours as needed for nausea or vomiting. (Patient not taking: Reported on 01/10/2022), Disp: 20 tablet, Rfl: 0   pantoprazole (PROTONIX) 40 MG tablet, Take 40 mg by mouth daily. (Patient not taking: Reported on 01/10/2022), Disp: , Rfl:    simvastatin (ZOCOR) 40 MG tablet, Take 40 mg by mouth every evening. , Disp: , Rfl:    sucralfate  (CARAFATE ) 1 g tablet, Take 1 g by mouth 2 (two) times daily before a meal. (Patient not taking: Reported on 01/10/2022), Disp: , Rfl:   Social History   Tobacco Use  Smoking Status Never  Smokeless Tobacco Never    Allergies  Allergen Reactions   Aspirin Buffered Nausea Only   Objective:  There were no vitals filed for this visit. There is no height or weight on file to calculate BMI. Constitutional Well developed. Well nourished.  Vascular Dorsalis pedis pulses palpable bilaterally. Posterior tibial pulses palpable bilaterally. Capillary refill normal to all digits.  No cyanosis or clubbing noted. Pedal hair growth normal.  Neurologic Normal speech. Oriented to person, place, and time. Epicritic sensation to light touch grossly present bilaterally.  Dermatologic Pain on palpation of the entire/total nail on 1st digit of the left No other open wounds. No skin lesions.  Orthopedic: Normal joint ROM without pain or crepitus bilaterally. No  visible deformities. No bony tenderness.   Radiographs: None Assessment:   1. Ingrown left big toenail    Plan:  Patient was evaluated and treated and all questions answered.  Nail contusion/dystrophy hallux, left -Patient elects to proceed with minor surgery to remove entire toenail today. Consent reviewed and signed by patient. -Entire/total nail excised. See procedure note. -Educated on post-procedure care including soaking. Written instructions provided and reviewed. -Patient to follow up in 2 weeks for nail check.  Procedure: Excision of entire/total nail  Location: Left 1st toe  digit Anesthesia: Lidocaine  1% plain; 1.5 mL and Marcaine 0.5% plain; 1.5 mL, digital block. Skin Prep: Betadine . Dressing: Silvadene; telfa; dry, sterile, compression dressing. Technique: Following skin prep, the toe was exsanguinated and a tourniquet was secured at the base of the toe. The affected nail border was freed and excised. The tourniquet was then removed and sterile dressing applied. Disposition: Patient tolerated procedure well. Patient to return in 2 weeks for follow-up.   Return in about 3 months (around 09/10/2024).

## 2024-06-14 ENCOUNTER — Encounter: Admitting: Physician Assistant

## 2024-06-14 DIAGNOSIS — L97812 Non-pressure chronic ulcer of other part of right lower leg with fat layer exposed: Secondary | ICD-10-CM | POA: Diagnosis not present

## 2024-06-14 DIAGNOSIS — I890 Lymphedema, not elsewhere classified: Secondary | ICD-10-CM | POA: Diagnosis not present

## 2024-06-14 DIAGNOSIS — I87332 Chronic venous hypertension (idiopathic) with ulcer and inflammation of left lower extremity: Secondary | ICD-10-CM | POA: Diagnosis not present

## 2024-06-22 ENCOUNTER — Encounter: Admitting: Internal Medicine

## 2024-06-22 DIAGNOSIS — L97812 Non-pressure chronic ulcer of other part of right lower leg with fat layer exposed: Secondary | ICD-10-CM | POA: Diagnosis not present

## 2024-06-22 DIAGNOSIS — I890 Lymphedema, not elsewhere classified: Secondary | ICD-10-CM | POA: Diagnosis not present

## 2024-06-22 DIAGNOSIS — I87331 Chronic venous hypertension (idiopathic) with ulcer and inflammation of right lower extremity: Secondary | ICD-10-CM | POA: Diagnosis not present

## 2024-07-02 ENCOUNTER — Encounter: Attending: Physician Assistant | Admitting: Physician Assistant

## 2024-07-02 DIAGNOSIS — H913 Deaf nonspeaking, not elsewhere classified: Secondary | ICD-10-CM | POA: Insufficient documentation

## 2024-07-02 DIAGNOSIS — E11622 Type 2 diabetes mellitus with other skin ulcer: Secondary | ICD-10-CM | POA: Insufficient documentation

## 2024-07-02 DIAGNOSIS — L89892 Pressure ulcer of other site, stage 2: Secondary | ICD-10-CM | POA: Diagnosis not present

## 2024-07-02 DIAGNOSIS — L97822 Non-pressure chronic ulcer of other part of left lower leg with fat layer exposed: Secondary | ICD-10-CM | POA: Insufficient documentation

## 2024-07-02 DIAGNOSIS — I10 Essential (primary) hypertension: Secondary | ICD-10-CM | POA: Insufficient documentation

## 2024-07-02 DIAGNOSIS — L97812 Non-pressure chronic ulcer of other part of right lower leg with fat layer exposed: Secondary | ICD-10-CM | POA: Diagnosis not present

## 2024-07-02 DIAGNOSIS — I890 Lymphedema, not elsewhere classified: Secondary | ICD-10-CM | POA: Insufficient documentation

## 2024-07-02 DIAGNOSIS — I87333 Chronic venous hypertension (idiopathic) with ulcer and inflammation of bilateral lower extremity: Secondary | ICD-10-CM | POA: Diagnosis not present

## 2024-07-06 DIAGNOSIS — I890 Lymphedema, not elsewhere classified: Secondary | ICD-10-CM | POA: Diagnosis not present

## 2024-07-08 ENCOUNTER — Other Ambulatory Visit: Payer: Self-pay | Admitting: Family Medicine

## 2024-07-08 DIAGNOSIS — Z1231 Encounter for screening mammogram for malignant neoplasm of breast: Secondary | ICD-10-CM

## 2024-07-12 ENCOUNTER — Encounter: Admitting: Physician Assistant

## 2024-07-12 DIAGNOSIS — I87331 Chronic venous hypertension (idiopathic) with ulcer and inflammation of right lower extremity: Secondary | ICD-10-CM | POA: Diagnosis not present

## 2024-07-12 DIAGNOSIS — I890 Lymphedema, not elsewhere classified: Secondary | ICD-10-CM | POA: Diagnosis not present

## 2024-07-12 DIAGNOSIS — L97812 Non-pressure chronic ulcer of other part of right lower leg with fat layer exposed: Secondary | ICD-10-CM | POA: Diagnosis not present

## 2024-07-12 DIAGNOSIS — L89892 Pressure ulcer of other site, stage 2: Secondary | ICD-10-CM | POA: Diagnosis not present

## 2024-07-20 ENCOUNTER — Encounter: Admitting: Physician Assistant

## 2024-07-20 DIAGNOSIS — L89892 Pressure ulcer of other site, stage 2: Secondary | ICD-10-CM | POA: Diagnosis not present

## 2024-07-20 DIAGNOSIS — I890 Lymphedema, not elsewhere classified: Secondary | ICD-10-CM | POA: Diagnosis not present

## 2024-08-18 ENCOUNTER — Encounter

## 2024-09-13 ENCOUNTER — Ambulatory Visit (INDEPENDENT_AMBULATORY_CARE_PROVIDER_SITE_OTHER): Admitting: Podiatry

## 2024-09-13 DIAGNOSIS — Z91198 Patient's noncompliance with other medical treatment and regimen for other reason: Secondary | ICD-10-CM

## 2024-09-16 ENCOUNTER — Ambulatory Visit

## 2024-09-19 NOTE — Progress Notes (Signed)
 1. Failure to attend appointment with reason given    Appointment rescheduled by patient.

## 2024-09-20 ENCOUNTER — Encounter

## 2024-10-07 ENCOUNTER — Ambulatory Visit

## 2024-10-07 DIAGNOSIS — Z9889 Other specified postprocedural states: Secondary | ICD-10-CM | POA: Diagnosis not present

## 2024-10-07 DIAGNOSIS — B351 Tinea unguium: Secondary | ICD-10-CM

## 2024-10-08 NOTE — Progress Notes (Signed)
°  Subjective:  Patient ID: Judith Mitchell, female    DOB: [birth year],  MRN: [id number]  73 y.o. female presents for routine care of her left foot nails. She had a nail avulsion to her left hallux 06/10/24 and is curious if it will grow out any further. She denies any trauma, complications, or other pathology to her feet. A virtual sign language interpretor was used.   PCP is Valora Lynwood FALCON, MD .  Allergies[1]  Review of Systems: Negative except as noted in the HPI.   Objective:  Judith Mitchell is a pleasant 73 y.o. female in NAD. AAO x 3.  Vascular Examination: Vascular status intact left with palpable pedal pulses. CFT immediate. Pedal hair present. No edema. No pain with calf compression. Skin temperature gradient WNL.  No varicosities noted. No cyanosis or clubbing noted.  Neurological Examination: Sensation grossly intact with 10 gram monofilament. Negative tinel sign at tarsal tunnel.   Dermatological Examination: Pedal skin with normal turgor, texture and tone b/l. No open wounds nor interdigital macerations noted. Hallux toenail is shortened in appearance, growing back from previous avulsion. No signs of infection or complication. 2-5 thick, discolored, elongated with subungual debris and pain on dorsal palpation. No hyperkeratotic lesions noted.    Musculoskeletal Examination: Muscle strength 5/5 to LE.  No pain, crepitus noted. No gross pedal deformities. Patient ambulates independently without assistive aids.   Radiographs: None Last A1c:       No data to display           Assessment:   1. Onychomycosis   2. Status post nail surgery     Plan:  Onychomycosis - Debrided nails 2-5 on left foot sharply without incident. She did not want to have her right foot addressed  Left hallux nail avulsion site - Hallux nail growing back slowly, we discussed that it may continue to grow to normal length or remain somewhat shortened compared to the other toenails. She  expresses understanding of this   Prentice Ovens, DPM AACFAS Fellowship Trained Podiatric Surgeon [facility]       [place] LOCATION: [address] [place] [address]                   Office [phone]   [place] LOCATION: [address] [place], [place] [address] Office [phone]     [1]  Allergies Allergen Reactions   Aspirin Buffered Nausea Only

## 2024-11-02 NOTE — Progress Notes (Addendum)
 Judith Mitchell                                          MRN: [id number]   11/02/2024   The [facility] Quality Team Specialist reviewed this patient medical record for the purposes of chart review for care gap closure. The following were reviewed: chart review for care gap closure-controlling blood pressure.  11/11/2024- cannot close KED for 2025    [facility] Quality Team

## 2024-11-03 ENCOUNTER — Ambulatory Visit
Admission: RE | Admit: 2024-11-03 | Discharge: 2024-11-03 | Disposition: A | Discharge status: Home / Self Care | Source: Ambulatory Visit | Attending: Family Medicine | Admitting: Family Medicine

## 2024-11-03 DIAGNOSIS — Z1231 Encounter for screening mammogram for malignant neoplasm of breast: Secondary | ICD-10-CM | POA: Diagnosis present

## 2024-11-22 ENCOUNTER — Encounter: Payer: Self-pay | Admitting: Intensive Care

## 2024-11-22 ENCOUNTER — Emergency Department

## 2024-11-22 ENCOUNTER — Emergency Department: Admission: EM | Admit: 2024-11-22 | Discharge: 2024-11-22 | Disposition: A | Discharge status: Home / Self Care

## 2024-11-22 ENCOUNTER — Other Ambulatory Visit: Payer: Self-pay

## 2024-11-22 DIAGNOSIS — I10 Essential (primary) hypertension: Secondary | ICD-10-CM | POA: Insufficient documentation

## 2024-11-22 DIAGNOSIS — E119 Type 2 diabetes mellitus without complications: Secondary | ICD-10-CM | POA: Insufficient documentation

## 2024-11-22 DIAGNOSIS — M25511 Pain in right shoulder: Secondary | ICD-10-CM | POA: Insufficient documentation

## 2024-11-22 MED ORDER — METHOCARBAMOL 500 MG PO TABS
500.0000 mg | ORAL_TABLET | Freq: Once | ORAL | Status: AC
  Administered 2024-11-22: 500 mg via ORAL
  Filled 2024-11-22: qty 1

## 2024-11-22 MED ORDER — METHOCARBAMOL 500 MG PO TABS: 500.0000 mg | ORAL_TABLET | Freq: Four times a day (QID) | ORAL | 0 refills | Status: AC

## 2024-11-22 MED ORDER — LIDOCAINE 5 % EX PTCH
1.0000 | MEDICATED_PATCH | CUTANEOUS | Status: DC
  Administered 2024-11-22: 1 via TRANSDERMAL
  Filled 2024-11-22: qty 1

## 2024-11-22 MED ORDER — ACETAMINOPHEN 500 MG PO TABS
1000.0000 mg | ORAL_TABLET | Freq: Once | ORAL | Status: AC
  Administered 2024-11-22: 1000 mg via ORAL
  Filled 2024-11-22: qty 2

## 2024-11-22 NOTE — ED Provider Notes (Signed)
 "   [facility] Emergency Department Provider Note     Event Date/Time   First MD Initiated Contact with Patient 11/22/24 1251     (approximate)   History   Shoulder Pain   HPI  TOSHI ISHII is a 74 y.o. female with a past medical history of deafness, HTN, diabetes, arthritis presents to the ED for evaluation of right shoulder pain after waking up this morning.  Patient denies any injuries or falls.  Reports the pain is localized to scapular region.  Pain is worse with movement.  Patient placed a lidocaine  patch over the area and reports some relief however pain not completely resolved.  No previous injuries to this shoulder.  Patient denies chest pain or shortness of breath.      Physical Exam   Triage Vital Signs: ED Triage Vitals  Encounter Vitals Group     BP 11/22/24 1204 (!) 206/83     Girls Systolic BP Percentile --      Girls Diastolic BP Percentile --      Boys Systolic BP Percentile --      Boys Diastolic BP Percentile --      Pulse Rate 11/22/24 1202 72     Resp 11/22/24 1202 18     Temp 11/22/24 1202 98 F (36.7 C)     Temp Source 11/22/24 1202 Oral     SpO2 11/22/24 1202 94 %     Weight 11/22/24 1200 287 lb 14.7 oz (130.6 kg)     Height 11/22/24 1200 5' 5 (1.651 m)     Head Circumference --      Peak Flow --      Pain Score 11/22/24 1200 8     Pain Loc --      Pain Education --      Exclude from Growth Chart --     Most recent vital signs: Vitals:   11/22/24 1202 11/22/24 1204  BP:  (!) 206/83  Pulse: 72   Resp: 18   Temp: 98 F (36.7 C)   SpO2: 94%     General Awake, no distress.  HEENT NCAT.  CV:  Good peripheral perfusion. RRR RESP:  Normal effort.  ABD:  No distention.  Other:  No visible deformity to right shoulder.  No skin color changes.  Tenderness to palpation over AC joint.  Patient is able to actively extend and flex her shoulder.  Limited secondary to pain.  Good range of motion at elbow joint.   Neurovascular status intact all throughout.  ED Results / Procedures / Treatments   Labs (all labs ordered are listed, but only abnormal results are displayed) Labs Reviewed - No data to display  RADIOLOGY  I personally viewed and evaluated these images as part of my medical decision making, as well as reviewing the written report by the radiologist.  ED Provider Interpretation: No acute bony abnormality noted on my interpretation, will confirm with final radiology read.  DG Shoulder Right Result Date: 11/22/2024 EXAM: 1 VIEW(S) XRAY OF THE RIGHT SHOULDER 11/22/2024 12:45:46 PM COMPARISON: None available. CLINICAL HISTORY: Pain. FINDINGS: BONES AND JOINTS: Glenohumeral joint is normally aligned. No acute fracture. No malalignment. Moderate degenerative changes of the acromioclavicular joint with joint space narrowing and osteophyte formation. SOFT TISSUES: No abnormal calcifications. Visualized lung is unremarkable. IMPRESSION: 1. No acute findings. 2. Moderate acromioclavicular joint osteoarthritis. Electronically signed by: Waddell Calk MD 11/22/2024 12:54 PM EST RP Workstation: [id number]    PROCEDURES:  Critical  Care performed: No  Procedures   MEDICATIONS ORDERED IN ED: Medications  acetaminophen  (TYLENOL ) tablet 1,000 mg (has no administration in time range)  methocarbamol  (ROBAXIN ) tablet 500 mg (has no administration in time range)  lidocaine  (LIDODERM ) 5 % 1 patch (has no administration in time range)     IMPRESSION / MDM / ASSESSMENT AND PLAN / ED COURSE  I reviewed the triage vital signs and the nursing notes.                               74 y.o. female presents to the emergency department for evaluation and treatment of acute nontraumatic right shoulder pain. See HPI for further details.   Differential diagnosis includes, but is not limited to strain, tendinitis, fracture considered but less likely, dislocation, arthritis  Patient's presentation is most  consistent with acute complicated illness / injury requiring diagnostic workup.  Noted initial elevated BP of 206/83.  This appears to be patient's baseline.  On general assessment patient is well-appearing in no acute distress.  X-ray of the right shoulder is unremarkable.  No red flag signs to indicate further workup at this time.  Will treat with Tylenol , muscle relaxant and lidocaine  patch.  Patient was offered sling however declined.  Work note requested and provided.  She is in stable condition for discharge home.  Will refer to orthopedic if symptoms do not improve.  ED return precaution discussed.  FINAL CLINICAL IMPRESSION(S) / ED DIAGNOSES   Final diagnoses:  Acute pain of right shoulder   Rx / DC Orders   ED Discharge Orders     None      Note:  This document was prepared using Dragon voice recognition software and may include unintentional dictation errors.    Margrette Monte A, PA-C 11/22/24 1404    Clarine Ozell LABOR, MD 11/22/24 2018  "

## 2024-11-22 NOTE — ED Notes (Signed)
 See triage note  Presents with right shoulder pain  States pain started 1-2 days ago w/o injury  No deformity noted  Good pulses

## 2024-11-22 NOTE — ED Triage Notes (Signed)
 Patient uses sign language.   Patient presents with right shoulder pain upon awakening this AM. Denies injury

## 2024-11-22 NOTE — Discharge Instructions (Addendum)
 Your evaluated in the ED for right shoulder pain.  Your x-ray is normal. Alternate between Tylenol  and ibuprofen for pain as needed.  A prescription for muscle relaxer has been sent to your pharmacy.   If symptoms persist please follow-up with orthopedics for further evaluation.

## 2025-01-06 ENCOUNTER — Ambulatory Visit
